# Patient Record
Sex: Female | Born: 1995 | Race: Black or African American | Hispanic: No | Marital: Single | Smoking: Never smoker
Health system: Southern US, Community
[De-identification: ages and names within clinical notes are randomized; demographics above are authoritative.]

## PROBLEM LIST (undated history)

## (undated) ENCOUNTER — Inpatient Hospital Stay (HOSPITAL_COMMUNITY): Payer: Self-pay

## (undated) DIAGNOSIS — E669 Obesity, unspecified: Secondary | ICD-10-CM

## (undated) DIAGNOSIS — Z789 Other specified health status: Secondary | ICD-10-CM

## (undated) HISTORY — PX: WISDOM TOOTH EXTRACTION: SHX21

## (undated) HISTORY — PX: TONSILLECTOMY: SUR1361

---

## 2015-05-14 ENCOUNTER — Encounter (HOSPITAL_COMMUNITY): Payer: Self-pay | Admitting: Emergency Medicine

## 2015-05-14 ENCOUNTER — Emergency Department (HOSPITAL_COMMUNITY): Payer: BLUE CROSS/BLUE SHIELD

## 2015-05-14 ENCOUNTER — Emergency Department (HOSPITAL_COMMUNITY)
Admission: EM | Admit: 2015-05-14 | Discharge: 2015-05-14 | Disposition: A | Discharge status: Home / Self Care | Payer: BLUE CROSS/BLUE SHIELD | Attending: Emergency Medicine | Admitting: Emergency Medicine

## 2015-05-14 DIAGNOSIS — J029 Acute pharyngitis, unspecified: Secondary | ICD-10-CM | POA: Diagnosis present

## 2015-05-14 DIAGNOSIS — J069 Acute upper respiratory infection, unspecified: Secondary | ICD-10-CM | POA: Diagnosis not present

## 2015-05-14 DIAGNOSIS — R079 Chest pain, unspecified: Secondary | ICD-10-CM | POA: Diagnosis not present

## 2015-05-14 LAB — RAPID STREP SCREEN (MED CTR MEBANE ONLY): STREPTOCOCCUS, GROUP A SCREEN (DIRECT): NEGATIVE

## 2015-05-14 NOTE — Discharge Instructions (Signed)
Tylenol 1000 mg rotated with Motrin 600 mg every 4 hours as needed for fever or pain.  Over-the-counter cough medications as needed for symptomatic relief.  Return to the ER if symptoms significantly worsen or change.   Upper Respiratory Infection, Adult Most upper respiratory infections (URIs) are a viral infection of the air passages leading to the lungs. A URI affects the nose, throat, and upper air passages. The most common type of URI is nasopharyngitis and is typically referred to as "the common cold." URIs run their course and usually go away on their own. Most of the time, a URI does not require medical attention, but sometimes a bacterial infection in the upper airways can follow a viral infection. This is called a secondary infection. Sinus and middle ear infections are common types of secondary upper respiratory infections. Bacterial pneumonia can also complicate a URI. A URI can worsen asthma and chronic obstructive pulmonary disease (COPD). Sometimes, these complications can require emergency medical care and may be life threatening.  CAUSES Almost all URIs are caused by viruses. A virus is a type of germ and can spread from one person to another.  RISKS FACTORS You may be at risk for a URI if:   You smoke.   You have chronic heart or lung disease.  You have a weakened defense (immune) system.   You are very young or very old.   You have nasal allergies or asthma.  You work in crowded or poorly ventilated areas.  You work in health care facilities or schools. SIGNS AND SYMPTOMS  Symptoms typically develop 2-3 days after you come in contact with a cold virus. Most viral URIs last 7-10 days. However, viral URIs from the influenza virus (flu virus) can last 14-18 days and are typically more severe. Symptoms may include:   Runny or stuffy (congested) nose.   Sneezing.   Cough.   Sore throat.   Headache.   Fatigue.   Fever.   Loss of appetite.   Pain  in your forehead, behind your eyes, and over your cheekbones (sinus pain).  Muscle aches.  DIAGNOSIS  Your health care provider may diagnose a URI by:  Physical exam.  Tests to check that your symptoms are not due to another condition such as:  Strep throat.  Sinusitis.  Pneumonia.  Asthma. TREATMENT  A URI goes away on its own with time. It cannot be cured with medicines, but medicines may be prescribed or recommended to relieve symptoms. Medicines may help:  Reduce your fever.  Reduce your cough.  Relieve nasal congestion. HOME CARE INSTRUCTIONS   Take medicines only as directed by your health care provider.   Gargle warm saltwater or take cough drops to comfort your throat as directed by your health care provider.  Use a warm mist humidifier or inhale steam from a shower to increase air moisture. This may make it easier to breathe.  Drink enough fluid to keep your urine clear or pale yellow.   Eat soups and other clear broths and maintain good nutrition.   Rest as needed.   Return to work when your temperature has returned to normal or as your health care provider advises. You may need to stay home longer to avoid infecting others. You can also use a face mask and careful hand washing to prevent spread of the virus.  Increase the usage of your inhaler if you have asthma.   Do not use any tobacco products, including cigarettes, chewing tobacco, or electronic  cigarettes. If you need help quitting, ask your health care provider. PREVENTION  The best way to protect yourself from getting a cold is to practice good hygiene.   Avoid oral or hand contact with people with cold symptoms.   Wash your hands often if contact occurs.  There is no clear evidence that vitamin C, vitamin E, echinacea, or exercise reduces the chance of developing a cold. However, it is always recommended to get plenty of rest, exercise, and practice good nutrition.  SEEK MEDICAL CARE IF:     You are getting worse rather than better.   Your symptoms are not controlled by medicine.   You have chills.  You have worsening shortness of breath.  You have brown or red mucus.  You have yellow or brown nasal discharge.  You have pain in your face, especially when you bend forward.  You have a fever.  You have swollen neck glands.  You have pain while swallowing.  You have white areas in the back of your throat. SEEK IMMEDIATE MEDICAL CARE IF:   You have severe or persistent:  Headache.  Ear pain.  Sinus pain.  Chest pain.  You have chronic lung disease and any of the following:  Wheezing.  Prolonged cough.  Coughing up blood.  A change in your usual mucus.  You have a stiff neck.  You have changes in your:  Vision.  Hearing.  Thinking.  Mood. MAKE SURE YOU:   Understand these instructions.  Will watch your condition.  Will get help right away if you are not doing well or get worse.   This information is not intended to replace advice given to you by your health care provider. Make sure you discuss any questions you have with your health care provider.   Document Released: 10/01/2000 Document Revised: 08/22/2014 Document Reviewed: 07/13/2013 Elsevier Interactive Patient Education [employer]

## 2015-05-14 NOTE — ED Notes (Signed)
Patient transported to X-ray 

## 2015-05-14 NOTE — ED Notes (Signed)
Pt reports chest "tightness" and sore throat since yesterday 8am.  Denies N/V, fever, diarrhea, SOB.

## 2015-05-14 NOTE — ED Provider Notes (Signed)
CSN: [id number]     Arrival date & time 05/14/15  0449 History   First MD Initiated Contact with Patient 05/14/15 0518     Chief Complaint  Patient presents with  . Sore Throat  . Chest Pain     (Consider location/radiation/quality/duration/timing/severity/associated sxs/prior Treatment) HPI Comments: Patient is a 20 year old female who presents for evaluation of sore throat and cough. This started yesterday. She feels tight in her chest. She denies any fevers or chills. She denies any difficulty breathing. She denies any ill contacts. She does report coughing up yellow sputum earlier today.  Patient is a 20 y.o. female presenting with pharyngitis.  Sore Throat This is a new problem. The current episode started yesterday. The problem occurs constantly. The problem has been gradually worsening. The symptoms are aggravated by swallowing (Coughing). Nothing relieves the symptoms. She has tried nothing for the symptoms. The treatment provided no relief.    History reviewed. No pertinent past medical history. Past Surgical History  Procedure Laterality Date  . Tonsillectomy     No family history on file. Social History  Substance Use Topics  . Smoking status: Never Smoker   . Smokeless tobacco: None  . Alcohol Use: No   OB History    No data available     Review of Systems  All other systems reviewed and are negative.     Allergies  Review of patient's allergies indicates no known allergies.  Home Medications   Prior to Admission medications   Not on File   BP 116/96 mmHg  Pulse 75  Temp(Src) 98.1 F (36.7 C) (Oral)  Resp 16  Ht  (1.575 m)  SpO2 100% Physical Exam  Constitutional: She is oriented to person, place, and time. She appears well-developed and well-nourished. No distress.  HENT:  Head: Normocephalic and atraumatic.  Mouth/Throat: Oropharynx is clear and moist. No oropharyngeal exudate.  Neck: Normal range of motion. Neck supple.  Cardiovascular:  Normal rate and regular rhythm.  Exam reveals no gallop and no friction rub.   No murmur heard. Pulmonary/Chest: Effort normal and breath sounds normal. No respiratory distress. She has no wheezes.  Abdominal: Soft. Bowel sounds are normal. She exhibits no distension. There is no tenderness.  Musculoskeletal: Normal range of motion.  Neurological: She is alert and oriented to person, place, and time.  Skin: Skin is warm and dry. She is not diaphoretic.  Nursing note and vitals reviewed.   ED Course  Procedures (including critical care time) Labs Review Labs Reviewed  RAPID STREP SCREEN (NOT AT [facility])    Imaging Review No results found. I have personally reviewed and evaluated these images and lab results as part of my medical decision-making.    MDM   Final diagnoses:  None    Patient presents here with complaints of chest tightness, cough, and sore throat since yesterday. Her chest x-ray is clear, strep test is negative, and vital signs are stable. She is nontoxic appearing. I suspect a viral etiology to her symptoms. I will recommend Tylenol, Motrin, and over-the-counter medications as needed for symptomatic relief.    Geoffery Lyons, MD 05/14/15 [phone]

## 2015-05-16 LAB — CULTURE, GROUP A STREP (THRC)

## 2015-05-27 ENCOUNTER — Encounter (HOSPITAL_COMMUNITY): Payer: Self-pay

## 2015-05-27 ENCOUNTER — Inpatient Hospital Stay (HOSPITAL_COMMUNITY)
Admission: AD | Admit: 2015-05-27 | Discharge: 2015-05-27 | Disposition: A | Discharge status: Home / Self Care | Payer: BLUE CROSS/BLUE SHIELD | Source: Ambulatory Visit | Attending: Obstetrics & Gynecology | Admitting: Obstetrics & Gynecology

## 2015-05-27 DIAGNOSIS — N921 Excessive and frequent menstruation with irregular cycle: Secondary | ICD-10-CM

## 2015-05-27 DIAGNOSIS — N939 Abnormal uterine and vaginal bleeding, unspecified: Secondary | ICD-10-CM | POA: Diagnosis present

## 2015-05-27 DIAGNOSIS — N390 Urinary tract infection, site not specified: Secondary | ICD-10-CM | POA: Diagnosis not present

## 2015-05-27 HISTORY — DX: Other specified health status: Z78.9

## 2015-05-27 LAB — URINALYSIS, ROUTINE W REFLEX MICROSCOPIC
BILIRUBIN URINE: NEGATIVE
Glucose, UA: NEGATIVE mg/dL
KETONES UR: 15 mg/dL — AB
NITRITE: POSITIVE — AB
PH: 5.5 (ref 5.0–8.0)
Protein, ur: 100 mg/dL — AB
Specific Gravity, Urine: 1.025 (ref 1.005–1.030)

## 2015-05-27 LAB — URINE MICROSCOPIC-ADD ON

## 2015-05-27 LAB — CBC
HCT: 32.9 % — ABNORMAL LOW (ref 36.0–46.0)
HEMOGLOBIN: 10.2 g/dL — AB (ref 12.0–15.0)
MCH: 22.9 pg — AB (ref 26.0–34.0)
MCHC: 31 g/dL (ref 30.0–36.0)
MCV: 73.9 fL — ABNORMAL LOW (ref 78.0–100.0)
PLATELETS: 364 10*3/uL (ref 150–400)
RBC: 4.45 MIL/uL (ref 3.87–5.11)
RDW: 15.7 % — AB (ref 11.5–15.5)
WBC: 9.7 10*3/uL (ref 4.0–10.5)

## 2015-05-27 LAB — WET PREP, GENITAL
CLUE CELLS WET PREP: NONE SEEN
SPERM: NONE SEEN
Trich, Wet Prep: NONE SEEN
YEAST WET PREP: NONE SEEN

## 2015-05-27 LAB — POCT PREGNANCY, URINE: Preg Test, Ur: NEGATIVE

## 2015-05-27 MED ORDER — NITROFURANTOIN MONOHYD MACRO 100 MG PO CAPS: 100.0000 mg | ORAL_CAPSULE | Freq: Two times a day (BID) | ORAL | Status: DC

## 2015-05-27 MED ORDER — NORGESTIMATE-ETH ESTRADIOL 0.25-35 MG-MCG PO TABS: 1.0000 | ORAL_TABLET | Freq: Every day | ORAL | Status: DC

## 2015-05-27 MED ORDER — KETOROLAC TROMETHAMINE 60 MG/2ML IM SOLN
60.0000 mg | Freq: Once | INTRAMUSCULAR | Status: AC
  Administered 2015-05-27: 60 mg via INTRAMUSCULAR
  Filled 2015-05-27: qty 2

## 2015-05-27 NOTE — MAU Note (Signed)
Pt reports heavy vaginal bleeding for the last month. Pt reports going through 2 pads per hour and having large clots. Pt reports abdominal and back pain starting about 2 weeks ago. Pt is not using birth control at present time.

## 2015-05-27 NOTE — MAU Provider Note (Signed)
History     CSN: [id number]  Arrival date and time: 05/27/15 2024   First Provider Initiated Contact with Patient 05/27/15 2136      Chief Complaint  Patient presents with  . Vaginal Bleeding   Vaginal Bleeding The patient's primary symptoms include pelvic pain and vaginal bleeding. This is a new problem. The current episode started more than 1 month ago. The problem occurs constantly. The problem has been unchanged. Pain severity now: 7/10. The problem affects both sides. She is pregnant. Associated symptoms include abdominal pain. Pertinent negatives include no chills, constipation, diarrhea, dysuria, fever, frequency, nausea, urgency or vomiting. The vaginal discharge was bloody. The vaginal bleeding is heavier than menses (bleeding through a tampon and a pad. ). She has been passing clots. She has not been passing tissue. Nothing aggravates the symptoms. She has tried nothing for the symptoms. She is sexually active. She uses oral contraceptives (was taking birth control pills, and she states that she "thinks" she is still it. ) for contraception. Her menstrual history has been irregular.   Past Medical History  Diagnosis Date  . Medical history non-contributory     Past Surgical History  Procedure Laterality Date  . Tonsillectomy    . Wisdom tooth extraction      Family History  Problem Relation Age of Onset  . Diabetes Father   . Diabetes Maternal Grandfather   . Cancer Paternal Grandmother     Social History  Substance Use Topics  . Smoking status: Never Smoker   . Smokeless tobacco: Never Used  . Alcohol Use: No    Allergies: No Known Allergies  Prescriptions prior to admission  Medication Sig Dispense Refill Last Dose  . norgestimate-ethinyl estradiol (ORTHO-CYCLEN,SPRINTEC,PREVIFEM) 0.25-35 MG-MCG tablet Take 1 tablet by mouth daily.   Past Week at Unknown time    Review of Systems  Constitutional: Negative for fever and chills.  Gastrointestinal: Positive  for abdominal pain. Negative for nausea, vomiting, diarrhea and constipation.  Genitourinary: Positive for vaginal bleeding and pelvic pain. Negative for dysuria, urgency and frequency.   Physical Exam   Blood pressure 142/88, pulse 98, temperature 98.8 F (37.1 C), temperature source Oral, resp. rate 18, height  (1.575 m), last menstrual period 04/30/2015, SpO2 100 %.  Physical Exam  Nursing note and vitals reviewed. Constitutional: She is oriented to person, place, and time. She appears well-developed and well-nourished. No distress.  HENT:  Head: Normocephalic.  Cardiovascular: Normal rate.   Respiratory: Effort normal.  GI: Soft. There is no tenderness. There is no rebound.  Genitourinary:   External: no lesion Vagina: moderate amount of blood seen  Cervix: pink, smooth, no CMT Uterus: NSSC Adnexa: NT   Neurological: She is alert and oriented to person, place, and time.  Skin: Skin is warm and dry.  Psychiatric: She has a normal mood and affect.    Results for orders placed or performed during the hospital encounter of 05/27/15 (from the past 24 hour(s))  Urinalysis, Routine w reflex microscopic (not at [facility])     Status: Abnormal   Collection Time: 05/27/15  8:30 PM  Result Value Ref Range   Color, Urine RED (A) YELLOW   APPearance HAZY (A) CLEAR   Specific Gravity, Urine 1.025 1.005 - 1.030   pH 5.5 5.0 - 8.0   Glucose, UA NEGATIVE NEGATIVE mg/dL   Hgb urine dipstick LARGE (A) NEGATIVE   Bilirubin Urine NEGATIVE NEGATIVE   Ketones, ur 15 (A) NEGATIVE mg/dL   Protein, ur  100 (A) NEGATIVE mg/dL   Nitrite POSITIVE (A) NEGATIVE   Leukocytes, UA TRACE (A) NEGATIVE  Urine microscopic-add on     Status: Abnormal   Collection Time: 05/27/15  8:30 PM  Result Value Ref Range   Squamous Epithelial / LPF 0-5 (A) NONE SEEN   WBC, UA 0-5 0 - 5 WBC/hpf   RBC / HPF TOO NUMEROUS TO COUNT 0 - 5 RBC/hpf   Bacteria, UA MANY (A) NONE SEEN  Pregnancy, urine POC     Status: None    Collection Time: 05/27/15  8:39 PM  Result Value Ref Range   Preg Test, Ur NEGATIVE NEGATIVE  CBC     Status: Abnormal   Collection Time: 05/27/15  9:20 PM  Result Value Ref Range   WBC 9.7 4.0 - 10.5 K/uL   RBC 4.45 3.87 - 5.11 MIL/uL   Hemoglobin 10.2 (L) 12.0 - 15.0 g/dL   HCT 16.1 (L) 09.6 - 04.5 %   MCV 73.9 (L) 78.0 - 100.0 fL   MCH 22.9 (L) 26.0 - 34.0 pg   MCHC 31.0 30.0 - 36.0 g/dL   RDW 40.9 (H) 81.1 - 91.4 %   Platelets 364 150 - 400 K/uL  Wet prep, genital     Status: Abnormal   Collection Time: 05/27/15  9:40 PM  Result Value Ref Range   Yeast Wet Prep HPF POC NONE SEEN NONE SEEN   Trich, Wet Prep NONE SEEN NONE SEEN   Clue Cells Wet Prep HPF POC NONE SEEN NONE SEEN   WBC, Wet Prep HPF POC MODERATE (A) NONE SEEN   Sperm NONE SEEN     MAU Course  Procedures  MDM   Assessment and Plan   1. UTI (lower urinary tract infection)   2. Breakthrough bleeding on birth control pills    DC home Comfort measures reviewed  Bleeding precautions RX: macrobid BID x 5 days #10  Return to MAU as needed FU with OB as planned  Follow-up Information    Follow up with [facility].   Specialty:  Obstetrics and Gynecology   Why:  They will call you with an appointment   Contact information:   [address] [place] [address] [phone]        Tawnya Crook 05/27/2015, 9:37 PM

## 2015-05-27 NOTE — Discharge Instructions (Signed)

## 2015-05-28 LAB — GC/CHLAMYDIA PROBE AMP (~~LOC~~) NOT AT ARMC
Chlamydia: NEGATIVE
NEISSERIA GONORRHEA: NEGATIVE

## 2015-06-11 ENCOUNTER — Inpatient Hospital Stay (HOSPITAL_COMMUNITY): Payer: BLUE CROSS/BLUE SHIELD

## 2015-06-11 ENCOUNTER — Inpatient Hospital Stay (HOSPITAL_COMMUNITY)
Admission: AD | Admit: 2015-06-11 | Discharge: 2015-06-11 | Disposition: A | Discharge status: Home / Self Care | Payer: BLUE CROSS/BLUE SHIELD | Source: Ambulatory Visit | Attending: Family Medicine | Admitting: Family Medicine

## 2015-06-11 ENCOUNTER — Encounter (HOSPITAL_COMMUNITY): Payer: Self-pay | Admitting: *Deleted

## 2015-06-11 DIAGNOSIS — N926 Irregular menstruation, unspecified: Secondary | ICD-10-CM | POA: Diagnosis not present

## 2015-06-11 DIAGNOSIS — N939 Abnormal uterine and vaginal bleeding, unspecified: Secondary | ICD-10-CM

## 2015-06-11 DIAGNOSIS — D500 Iron deficiency anemia secondary to blood loss (chronic): Secondary | ICD-10-CM | POA: Insufficient documentation

## 2015-06-11 LAB — URINE MICROSCOPIC-ADD ON

## 2015-06-11 LAB — URINALYSIS, ROUTINE W REFLEX MICROSCOPIC
BILIRUBIN URINE: NEGATIVE
GLUCOSE, UA: NEGATIVE mg/dL
KETONES UR: NEGATIVE mg/dL
NITRITE: NEGATIVE
PH: 6 (ref 5.0–8.0)
PROTEIN: 30 mg/dL — AB
Specific Gravity, Urine: 1.025 (ref 1.005–1.030)

## 2015-06-11 LAB — CBC
HEMATOCRIT: 29.5 % — AB (ref 36.0–46.0)
HEMOGLOBIN: 9.1 g/dL — AB (ref 12.0–15.0)
MCH: 22.9 pg — AB (ref 26.0–34.0)
MCHC: 30.8 g/dL (ref 30.0–36.0)
MCV: 74.1 fL — AB (ref 78.0–100.0)
PLATELETS: 346 10*3/uL (ref 150–400)
RBC: 3.98 MIL/uL (ref 3.87–5.11)
RDW: 15.7 % — AB (ref 11.5–15.5)
WBC: 10.2 10*3/uL (ref 4.0–10.5)

## 2015-06-11 LAB — POCT PREGNANCY, URINE: Preg Test, Ur: NEGATIVE

## 2015-06-11 MED ORDER — MEDROXYPROGESTERONE ACETATE 10 MG PO TABS: 10.0000 mg | ORAL_TABLET | Freq: Two times a day (BID) | ORAL | Status: DC

## 2015-06-11 MED ORDER — FERROUS SULFATE 325 (65 FE) MG PO TABS: 325.0000 mg | ORAL_TABLET | Freq: Two times a day (BID) | ORAL | Status: DC

## 2015-06-11 NOTE — MAU Note (Signed)
Heavy bleeding, passing heavy clots.  Was here earlier this month for the same   Cramps prior to passing clots.

## 2015-06-11 NOTE — Discharge Instructions (Signed)
Abnormal Uterine Bleeding °Abnormal uterine bleeding can affect women at various stages in life, including teenagers, women in their reproductive years, pregnant women, and women who have reached menopause. Several kinds of uterine bleeding are considered abnormal, including: °· Bleeding or spotting between periods.   °· Bleeding after sexual intercourse.   °· Bleeding that is heavier or more than normal.   °· Periods that last longer than usual. °· Bleeding after menopause.   °Many cases of abnormal uterine bleeding are minor and simple to treat, while others are more serious. Any type of abnormal bleeding should be evaluated by your health care provider. Treatment will depend on the cause of the bleeding. °HOME CARE INSTRUCTIONS °Monitor your condition for any changes. The following actions may help to alleviate any discomfort you are experiencing: °· Avoid the use of tampons and douches as directed by your health care provider. °· Change your pads frequently. °You should get regular pelvic exams and Pap tests. Keep all follow-up appointments for diagnostic tests as directed by your health care provider.  °SEEK MEDICAL CARE IF:  °· Your bleeding lasts more than 1 week.   °· You feel dizzy at times.   °SEEK IMMEDIATE MEDICAL CARE IF:  °· You pass out.   °· You are changing pads every 15 to 30 minutes.   °· You have abdominal pain. °· You have a fever.   °· You become sweaty or weak.   °· You are passing large blood clots from the vagina.   °· You start to feel nauseous and vomit. °MAKE SURE YOU:  °· Understand these instructions. °· Will watch your condition. °· Will get help right away if you are not doing well or get worse. °  °This information is not intended to replace advice given to you by your health care provider. Make sure you discuss any questions you have with your health care provider. °  °Document Released: 04/07/2005 Document Revised: 04/12/2013 Document Reviewed: 11/04/2012 °Elsevier Interactive  Patient Education ©2016 [employer] °Anemia, Nonspecific °Anemia is a condition in which the concentration of red blood cells or hemoglobin in the blood is below normal. Hemoglobin is a substance in red blood cells that carries oxygen to the tissues of the body. Anemia results in not enough oxygen reaching these tissues.  °CAUSES  °Common causes of anemia include:  °· Excessive bleeding. Bleeding may be internal or external. This includes excessive bleeding from periods (in women) or from the intestine.   °· Poor nutrition.   °· Chronic kidney, thyroid, and liver disease.  °· Bone marrow disorders that decrease red blood cell production. °· Cancer and treatments for cancer. °· HIV, AIDS, and their treatments. °· Spleen problems that increase red blood cell destruction. °· Blood disorders. °· Excess destruction of red blood cells due to infection, medicines, and autoimmune disorders. °SIGNS AND SYMPTOMS  °· Minor weakness.   °· Dizziness.   °· Headache. °· Palpitations.   °· Shortness of breath, especially with exercise.   °· Paleness. °· Cold sensitivity. °· Indigestion. °· Nausea. °· Difficulty sleeping. °· Difficulty concentrating. °Symptoms may occur suddenly or they may develop slowly.  °DIAGNOSIS  °Additional blood tests are often needed. These help your health care provider determine the best treatment. Your health care provider will check your stool for blood and look for other causes of blood loss.  °TREATMENT  °Treatment varies depending on the cause of the anemia. Treatment can include:  °· Supplements of iron, vitamin B12, or folic acid.   °· Hormone medicines.   °· A blood transfusion. This may be needed if blood loss is   severe.   °· Hospitalization. This may be needed if there is significant continual blood loss.   °· Dietary changes. °· Spleen removal. °HOME CARE INSTRUCTIONS °Keep all follow-up appointments. It often takes many weeks to correct anemia, and having your health care provider check on  your condition and your response to treatment is very important. °SEEK IMMEDIATE MEDICAL CARE IF:  °· You develop extreme weakness, shortness of breath, or chest pain.   °· You become dizzy or have trouble concentrating. °· You develop heavy vaginal bleeding.   °· You develop a rash.   °· You have bloody or black, tarry stools.   °· You faint.   °· You vomit up blood.   °· You vomit repeatedly.   °· You have abdominal pain. °· You have a fever or persistent symptoms for more than 2-3 days.   °· You have a fever and your symptoms suddenly get worse.   °· You are dehydrated.   °MAKE SURE YOU: °· Understand these instructions. °· Will watch your condition. °· Will get help right away if you are not doing well or get worse. °  °This information is not intended to replace advice given to you by your health care provider. Make sure you discuss any questions you have with your health care provider. °  °Document Released: 05/15/2004 Document Revised: 12/08/2012 Document Reviewed: 10/01/2012 °Elsevier Interactive Patient Education ©2016 [employer] ° °

## 2015-06-11 NOTE — MAU Provider Note (Signed)
History     CSN: [id number]  Arrival date and time: 06/11/15 1630   None     Chief Complaint  Patient presents with  . Vaginal Bleeding   HPI   Julie Malone is a 20 y.o. female G0P0000 with a history of abnormal menstrual cycles presents to MAU with vaginal bleeding. She was seen on 2/5 with vaginal bleeding; she was already taking birth control pills. No changes were made to her birth control pills at that time and she has been taking them as prescribed.  She has been on this same pill since Oct.  She did not have a period in Nov, Dec and started bleeding on January 4th. The bleeding has continued since then.   She is passing large clots at times.   Patient is not taking Iron pills. " I was never told I needed too".   Patient is scheduled for an appointment in the [facility] on Feb 23rd at 1330  OB History    Gravida Para Term Preterm AB TAB SAB Ectopic Multiple Living        Past Medical History  Diagnosis Date  . Medical history non-contributory     Past Surgical History  Procedure Laterality Date  . Tonsillectomy    . Wisdom tooth extraction      Family History  Problem Relation Age of Onset  . Diabetes Father   . Diabetes Maternal Grandfather   . Cancer Paternal Grandmother     Social History  Substance Use Topics  . Smoking status: Never Smoker   . Smokeless tobacco: Never Used  . Alcohol Use: No    Allergies: No Known Allergies  Prescriptions prior to admission  Medication Sig Dispense Refill Last Dose  . norgestimate-ethinyl estradiol (MONONESSA) 0.25-35 MG-MCG tablet Take 1 tablet by mouth daily. 1 Package 11 06/10/2015 at Unknown time  . nitrofurantoin, macrocrystal-monohydrate, (MACROBID) 100 MG capsule Take 1 capsule (100 mg total) by mouth 2 (two) times daily. (Patient not taking: Reported on 06/11/2015) 10 capsule 0    Results for orders placed or performed during the hospital encounter of 06/11/15 (from the past 48 hour(s))   Urinalysis, Routine w reflex microscopic (not at [facility])     Status: Abnormal   Collection Time: 06/11/15  5:00 PM  Result Value Ref Range   Color, Urine RED (A) YELLOW    Comment: BIOCHEMICALS MAY BE AFFECTED BY COLOR   APPearance HAZY (A) CLEAR   Specific Gravity, Urine 1.025 1.005 - 1.030   pH 6.0 5.0 - 8.0   Glucose, UA NEGATIVE NEGATIVE mg/dL   Hgb urine dipstick LARGE (A) NEGATIVE   Bilirubin Urine NEGATIVE NEGATIVE   Ketones, ur NEGATIVE NEGATIVE mg/dL   Protein, ur 30 (A) NEGATIVE mg/dL   Nitrite NEGATIVE NEGATIVE   Leukocytes, UA SMALL (A) NEGATIVE  Urine microscopic-add on     Status: Abnormal   Collection Time: 06/11/15  5:00 PM  Result Value Ref Range   Squamous Epithelial / LPF 0-5 (A) NONE SEEN   WBC, UA 0-5 0 - 5 WBC/hpf   RBC / HPF TOO NUMEROUS TO COUNT 0 - 5 RBC/hpf   Bacteria, UA FEW (A) NONE SEEN   Urine-Other URINALYSIS PERFORMED ON SUPERNATANT   Pregnancy, urine POC     Status: None   Collection Time: 06/11/15  5:12 PM  Result Value Ref Range   Preg Test, Ur NEGATIVE NEGATIVE    Comment:  THE SENSITIVITY OF THIS METHODOLOGY IS >24 mIU/mL   CBC     Status: Abnormal   Collection Time: 06/11/15  6:14 PM  Result Value Ref Range   WBC 10.2 4.0 - 10.5 K/uL   RBC 3.98 3.87 - 5.11 MIL/uL   Hemoglobin 9.1 (L) 12.0 - 15.0 g/dL   HCT 16.1 (L) 09.6 - 04.5 %   MCV 74.1 (L) 78.0 - 100.0 fL    Comment: REPEATED TO VERIFY   MCH 22.9 (L) 26.0 - 34.0 pg   MCHC 30.8 30.0 - 36.0 g/dL   RDW 40.9 (H) 81.1 - 91.4 %   Platelets 346 150 - 400 K/uL   Results for orders placed or performed during the hospital encounter of 06/11/15 (from the past 48 hour(s))  Urinalysis, Routine w reflex microscopic (not at [facility])     Status: Abnormal   Collection Time: 06/11/15  5:00 PM  Result Value Ref Range   Color, Urine RED (A) YELLOW    Comment: BIOCHEMICALS MAY BE AFFECTED BY COLOR   APPearance HAZY (A) CLEAR   Specific Gravity, Urine 1.025 1.005 - 1.030   pH 6.0 5.0 - 8.0    Glucose, UA NEGATIVE NEGATIVE mg/dL   Hgb urine dipstick LARGE (A) NEGATIVE   Bilirubin Urine NEGATIVE NEGATIVE   Ketones, ur NEGATIVE NEGATIVE mg/dL   Protein, ur 30 (A) NEGATIVE mg/dL   Nitrite NEGATIVE NEGATIVE   Leukocytes, UA SMALL (A) NEGATIVE  Urine microscopic-add on     Status: Abnormal   Collection Time: 06/11/15  5:00 PM  Result Value Ref Range   Squamous Epithelial / LPF 0-5 (A) NONE SEEN   WBC, UA 0-5 0 - 5 WBC/hpf   RBC / HPF TOO NUMEROUS TO COUNT 0 - 5 RBC/hpf   Bacteria, UA FEW (A) NONE SEEN   Urine-Other URINALYSIS PERFORMED ON SUPERNATANT   Pregnancy, urine POC     Status: None   Collection Time: 06/11/15  5:12 PM  Result Value Ref Range   Preg Test, Ur NEGATIVE NEGATIVE    Comment:        THE SENSITIVITY OF THIS METHODOLOGY IS >24 mIU/mL   CBC     Status: Abnormal   Collection Time: 06/11/15  6:14 PM  Result Value Ref Range   WBC 10.2 4.0 - 10.5 K/uL   RBC 3.98 3.87 - 5.11 MIL/uL   Hemoglobin 9.1 (L) 12.0 - 15.0 g/dL   HCT 78.2 (L) 95.6 - 21.3 %   MCV 74.1 (L) 78.0 - 100.0 fL    Comment: REPEATED TO VERIFY   MCH 22.9 (L) 26.0 - 34.0 pg   MCHC 30.8 30.0 - 36.0 g/dL   RDW 08.6 (H) 57.8 - 46.9 %   Platelets 346 150 - 400 K/uL    US Transvaginal Non-ob  06/11/2015  CLINICAL DATA:  Vaginal bleeding. EXAM: TRANSABDOMINAL AND TRANSVAGINAL ULTRASOUND OF PELVIS TECHNIQUE: Both transabdominal and transvaginal ultrasound examinations of the pelvis were performed. Transabdominal technique was performed for global imaging of the pelvis including uterus, ovaries, adnexal regions, and pelvic cul-de-sac. It was necessary to proceed with endovaginal exam following the transabdominal exam to visualize the ovaries and endometrium. COMPARISON:  None FINDINGS: Uterus Measurements: 8.5 x 3.8 x 4.0 cm. No fibroids or other mass visualized. Endometrium Thickness: 7.0 mm. Heterogeneous endometrial contents, extending into the dilated endocervical canal. This is likely hematoma.  Could not exclude products of conception. Right ovary Measurements: 2.7 x 2.0 x 2.3 cm. Normal appearance/no adnexal mass. Left  ovary Measurements: 2.5 x 2.2 x 1.8 cm. Normal appearance/no adnexal mass. Other findings No abnormal free fluid. IMPRESSION: 1. Heterogeneous endometrial contents extending into the dilated upper cervical canal. This could be a combination of hematoma and products of conception. 2. Normal myometrium. 3. Normal ovaries. Electronically Signed   By: Rudie Meyer M.D.   On: 06/11/2015 19:33   US Pelvis Complete  06/11/2015  CLINICAL DATA:  Vaginal bleeding. EXAM: TRANSABDOMINAL AND TRANSVAGINAL ULTRASOUND OF PELVIS TECHNIQUE: Both transabdominal and transvaginal ultrasound examinations of the pelvis were performed. Transabdominal technique was performed for global imaging of the pelvis including uterus, ovaries, adnexal regions, and pelvic cul-de-sac. It was necessary to proceed with endovaginal exam following the transabdominal exam to visualize the ovaries and endometrium. COMPARISON:  None FINDINGS: Uterus Measurements: 8.5 x 3.8 x 4.0 cm. No fibroids or other mass visualized. Endometrium Thickness: 7.0 mm. Heterogeneous endometrial contents, extending into the dilated endocervical canal. This is likely hematoma. Could not exclude products of conception. Right ovary Measurements: 2.7 x 2.0 x 2.3 cm. Normal appearance/no adnexal mass. Left ovary Measurements: 2.5 x 2.2 x 1.8 cm. Normal appearance/no adnexal mass. Other findings No abnormal free fluid. IMPRESSION: 1. Heterogeneous endometrial contents extending into the dilated upper cervical canal. This could be a combination of hematoma and products of conception. 2. Normal myometrium. 3. Normal ovaries. Electronically Signed   By: Rudie Meyer M.D.   On: 06/11/2015 19:33    Review of Systems  Constitutional: Negative for fever and chills.  Neurological: Positive for weakness. Negative for dizziness (None now; occasional dizziness  ).   Physical Exam   Blood pressure 131/73, pulse 90, temperature 98.7 F (37.1 C), temperature source Oral, resp. rate 18, height 5\' 3"  (1.6 m), weight 257 lb 6.4 oz (116.756 kg), last menstrual period 04/25/2015.  Physical Exam  Constitutional: She is oriented to person, place, and time. She appears well-developed and well-nourished. No distress.  HENT:  Head: Normocephalic.  Respiratory: Effort normal.  GI: Soft. She exhibits no distension. There is no tenderness. There is no rebound and no guarding.  Genitourinary:  Speculum exam: Vagina - Small amount of dark red blood noted in the vaginal canal, no odor. No clots  Cervix - smooth, pink Bimanual exam: Cervix slightly open, no CMT  Uterus non tender, normal size Adnexa non tender, no masses bilaterally Chaperone present for exam.  Musculoskeletal: Normal range of motion.  Neurological: She is alert and oriented to person, place, and time.  Skin: Skin is warm. She is not diaphoretic.  Psychiatric: Her behavior is normal.    MAU Course  Procedures  None  MDM  CBC 2/5: 10.2 CBC today 9.1  Discussed patient with Dr. Adrian Blackwater No Pelvic US on file. Pelvic US ordered due to drop in hemoglobin and patient reports occasional dizziness.   Pregnancy test negative.    Assessment and Plan    A:  1. Abnormal menstrual cycle   2. Vaginal bleeding   3. Anemia due to chronic blood loss     P:  Discharge home in stable condition  RX: Iron, provera Keep your appointment with [facility] on Thursday Bleeding precautions Return to MAU if symptoms worsen   Duane Lope, NP 06/11/2015 7:51 PM

## 2015-06-14 ENCOUNTER — Encounter: Payer: Self-pay | Admitting: Obstetrics & Gynecology

## 2015-06-14 ENCOUNTER — Ambulatory Visit (INDEPENDENT_AMBULATORY_CARE_PROVIDER_SITE_OTHER): Payer: BLUE CROSS/BLUE SHIELD | Admitting: Obstetrics & Gynecology

## 2015-06-14 VITALS — BP 136/66 | HR 84 | Ht 62.0 in | Wt 258.3 lb

## 2015-06-14 DIAGNOSIS — N921 Excessive and frequent menstruation with irregular cycle: Secondary | ICD-10-CM | POA: Diagnosis not present

## 2015-06-14 MED ORDER — DOXYCYCLINE HYCLATE 100 MG PO CAPS: 100.0000 mg | ORAL_CAPSULE | Freq: Two times a day (BID) | ORAL | Status: DC

## 2015-06-14 MED ORDER — IBUPROFEN 600 MG PO TABS: 600.0000 mg | ORAL_TABLET | Freq: Four times a day (QID) | ORAL | Status: DC | PRN

## 2015-06-14 NOTE — Progress Notes (Signed)
Patient ID: Julie Malone, female   DOB: [birth year], 20 y.o.   MRN: [id number]  Chief Complaint  Patient presents with  . Menorrhagia  BTB on OCP  HPI Julie Malone is a 20 y.o. female.  G0P0000  Irregular bleeding on OCP started in December and has persisted since early Jan with heavy bleeding at times, mild cramps. Has not responded to addition of Provera. States she is not sexually active currently.  HPI  Past Medical History  Diagnosis Date  . Medical history non-contributory     Past Surgical History  Procedure Laterality Date  . Tonsillectomy    . Wisdom tooth extraction      Family History  Problem Relation Age of Onset  . Diabetes Father   . Diabetes Maternal Grandfather   . Cancer Paternal Grandmother     Social History Social History  Substance Use Topics  . Smoking status: Never Smoker   . Smokeless tobacco: Never Used  . Alcohol Use: No    No Known Allergies  Current Outpatient Prescriptions  Medication Sig Dispense Refill  . ferrous sulfate 325 (65 FE) MG tablet Take 1 tablet (325 mg total) by mouth 2 (two) times daily with a meal. 30 tablet 0  . medroxyPROGESTERone (PROVERA) 10 MG tablet Take 1 tablet (10 mg total) by mouth 2 (two) times daily. 20 tablet 0  . norgestimate-ethinyl estradiol (MONONESSA) 0.25-35 MG-MCG tablet Take 1 tablet by mouth daily. 1 Package 11  . doxycycline (VIBRAMYCIN) 100 MG capsule Take 1 capsule (100 mg total) by mouth 2 (two) times daily. 20 capsule 0  . ibuprofen (ADVIL,MOTRIN) 600 MG tablet Take 1 tablet (600 mg total) by mouth every 6 (six) hours as needed. 30 tablet 1   No current facility-administered medications for this visit.    Review of Systems Review of Systems  Constitutional: Negative.   Genitourinary: Positive for menstrual problem and pelvic pain. Negative for vaginal discharge.    Blood pressure 136/66, pulse 84, height  (1.575 m), weight 258 lb 4.8 oz (117.164 kg), last menstrual period  04/25/2015.  Physical Exam Physical Exam  Constitutional: She appears well-developed. No distress.  Cardiovascular: Normal rate.   Pulmonary/Chest: Effort normal.  Psychiatric: She has a normal mood and affect. Her behavior is normal.    Data Reviewed CLINICAL DATA: Vaginal bleeding.  EXAM: TRANSABDOMINAL AND TRANSVAGINAL ULTRASOUND OF PELVIS  TECHNIQUE: Both transabdominal and transvaginal ultrasound examinations of the pelvis were performed. Transabdominal technique was performed for global imaging of the pelvis including uterus, ovaries, adnexal regions, and pelvic cul-de-sac. It was necessary to proceed with endovaginal exam following the transabdominal exam to visualize the ovaries and endometrium.  COMPARISON: None  FINDINGS: Uterus  Measurements: 8.5 x 3.8 x 4.0 cm. No fibroids or other mass visualized.  Endometrium  Thickness: 7.0 mm. Heterogeneous endometrial contents, extending into the dilated endocervical canal. This is likely hematoma. Could not exclude products of conception.  Right ovary  Measurements: 2.7 x 2.0 x 2.3 cm. Normal appearance/no adnexal mass.  Left ovary  Measurements: 2.5 x 2.2 x 1.8 cm. Normal appearance/no adnexal mass.  Other findings  No abnormal free fluid.  IMPRESSION: 1. Heterogeneous endometrial contents extending into the dilated upper cervical canal. This could be a combination of hematoma and products of conception. 2. Normal myometrium. 3. Normal ovaries.   Electronically Signed  By: Rudie Meyer M.D.  On: 06/11/2015 19:33  Assessment    DUB, BTB on OCP not responsive to addition of Provera  Plan    D/C OCP for now, continue to abstain which says she will do Ibuprofen 600 mg 2-3 / day for 7 days Doxycycline 100 mg BID 10 days possible chronic endometritis        ARNOLD,JAMES 06/14/2015, 2:37 PM

## 2015-07-12 ENCOUNTER — Ambulatory Visit: Payer: BLUE CROSS/BLUE SHIELD | Admitting: Obstetrics & Gynecology

## 2015-07-30 ENCOUNTER — Inpatient Hospital Stay (HOSPITAL_COMMUNITY)
Admission: AD | Admit: 2015-07-30 | Discharge: 2015-07-30 | Disposition: A | Discharge status: Home / Self Care | Payer: BLUE CROSS/BLUE SHIELD | Source: Ambulatory Visit | Attending: Obstetrics & Gynecology | Admitting: Obstetrics & Gynecology

## 2015-07-30 ENCOUNTER — Encounter (HOSPITAL_COMMUNITY): Payer: Self-pay | Admitting: *Deleted

## 2015-07-30 DIAGNOSIS — R102 Pelvic and perineal pain: Secondary | ICD-10-CM

## 2015-07-30 DIAGNOSIS — R079 Chest pain, unspecified: Secondary | ICD-10-CM | POA: Diagnosis present

## 2015-07-30 DIAGNOSIS — O9989 Other specified diseases and conditions complicating pregnancy, childbirth and the puerperium: Secondary | ICD-10-CM | POA: Diagnosis not present

## 2015-07-30 DIAGNOSIS — Z3201 Encounter for pregnancy test, result positive: Secondary | ICD-10-CM | POA: Insufficient documentation

## 2015-07-30 DIAGNOSIS — O99019 Anemia complicating pregnancy, unspecified trimester: Secondary | ICD-10-CM | POA: Diagnosis not present

## 2015-07-30 DIAGNOSIS — R55 Syncope and collapse: Secondary | ICD-10-CM | POA: Diagnosis present

## 2015-07-30 DIAGNOSIS — D649 Anemia, unspecified: Secondary | ICD-10-CM

## 2015-07-30 LAB — CBC
HEMATOCRIT: 32.9 % — AB (ref 36.0–46.0)
HEMOGLOBIN: 10 g/dL — AB (ref 12.0–15.0)
MCH: 21.5 pg — AB (ref 26.0–34.0)
MCHC: 30.4 g/dL (ref 30.0–36.0)
MCV: 70.6 fL — AB (ref 78.0–100.0)
Platelets: 409 10*3/uL — ABNORMAL HIGH (ref 150–400)
RBC: 4.66 MIL/uL (ref 3.87–5.11)
RDW: 16.2 % — ABNORMAL HIGH (ref 11.5–15.5)
WBC: 11.6 10*3/uL — ABNORMAL HIGH (ref 4.0–10.5)

## 2015-07-30 LAB — URINE MICROSCOPIC-ADD ON

## 2015-07-30 LAB — URINALYSIS, ROUTINE W REFLEX MICROSCOPIC
Bilirubin Urine: NEGATIVE
GLUCOSE, UA: NEGATIVE mg/dL
Ketones, ur: NEGATIVE mg/dL
LEUKOCYTES UA: NEGATIVE
Nitrite: NEGATIVE
PH: 5.5 (ref 5.0–8.0)
Protein, ur: NEGATIVE mg/dL
SPECIFIC GRAVITY, URINE: 1.02 (ref 1.005–1.030)

## 2015-07-30 LAB — HCG, QUANTITATIVE, PREGNANCY: HCG, BETA CHAIN, QUANT, S: 42 m[IU]/mL — AB (ref <5)

## 2015-07-30 LAB — POCT PREGNANCY, URINE: Preg Test, Ur: NEGATIVE

## 2015-07-30 MED ORDER — PROMETHAZINE HCL 25 MG PO TABS: 25.0000 mg | ORAL_TABLET | Freq: Four times a day (QID) | ORAL | Status: DC | PRN

## 2015-07-30 NOTE — Discharge Instructions (Signed)
First Trimester of Pregnancy  The first trimester of pregnancy is from week 1 until the end of week 12 (months 1 through 3). A week after a sperm fertilizes an egg, the egg will implant on the wall of the uterus. This embryo will begin to develop into a baby. Genes from you and your partner are forming the baby. The female genes determine whether the baby is a boy or a girl. At 6-8 weeks, the eyes and face are formed, and the heartbeat can be seen on ultrasound. At the end of 12 weeks, all the baby's organs are formed.   Now that you are pregnant, you will want to do everything you can to have a healthy baby. Two of the most important things are to get good prenatal care and to follow your health care provider's instructions. Prenatal care is all the medical care you receive before the baby's birth. This care will help prevent, find, and treat any problems during the pregnancy and childbirth.  BODY CHANGES  Your body goes through many changes during pregnancy. The changes vary from woman to woman.   · You may gain or lose a couple of pounds at first.  · You may feel sick to your stomach (nauseous) and throw up (vomit). If the vomiting is uncontrollable, call your health care provider.  · You may tire easily.  · You may develop headaches that can be relieved by medicines approved by your health care provider.  · You may urinate more often. Painful urination may mean you have a bladder infection.  · You may develop heartburn as a result of your pregnancy.  · You may develop constipation because certain hormones are causing the muscles that push waste through your intestines to slow down.  · You may develop hemorrhoids or swollen, bulging veins (varicose veins).  · Your breasts may begin to grow larger and become tender. Your nipples may stick out more, and the tissue that surrounds them (areola) may become darker.  · Your gums may bleed and may be sensitive to brushing and flossing.   · Dark spots or blotches (chloasma, mask of pregnancy) may develop on your face. This will likely fade after the baby is born.  · Your menstrual periods will stop.  · You may have a loss of appetite.  · You may develop cravings for certain kinds of food.  · You may have changes in your emotions from day to day, such as being excited to be pregnant or being concerned that something may go wrong with the pregnancy and baby.  · You may have more vivid and strange dreams.  · You may have changes in your hair. These can include thickening of your hair, rapid growth, and changes in texture. Some women also have hair loss during or after pregnancy, or hair that feels dry or thin. Your hair will most likely return to normal after your baby is born.  WHAT TO EXPECT AT YOUR PRENATAL VISITS  During a routine prenatal visit:  · You will be weighed to make sure you and the baby are growing normally.  · Your blood pressure will be taken.  · Your abdomen will be measured to track your baby's growth.  · The fetal heartbeat will be listened to starting around week 10 or 12 of your pregnancy.  · Test results from any previous visits will be discussed.  Your health care provider may ask you:  · How you are feeling.  · If you   including cigarettes, chewing tobacco, and electronic cigarettes. °· If you have any questions. °Other tests that may be performed during your first trimester include: °· Blood tests to find your blood type and to check for the presence of any previous infections. They will also be used to check for low iron levels (anemia) and Rh antibodies. Later in the pregnancy, blood tests for diabetes will be done along with other tests if problems develop. °· Urine tests to check for infections, diabetes, or protein in the urine. °· An ultrasound to confirm the proper growth  and development of the baby. °· An amniocentesis to check for possible genetic problems. °· Fetal screens for spina bifida and Down syndrome. °· You may need other tests to make sure you and the baby are doing well. °· HIV (human immunodeficiency virus) testing. Routine prenatal testing includes screening for HIV, unless you choose not to have this test. °HOME CARE INSTRUCTIONS  °Medicines °· Follow your health care provider's instructions regarding medicine use. Specific medicines may be either safe or unsafe to take during pregnancy. °· Take your prenatal vitamins as directed. °· If you develop constipation, try taking a stool softener if your health care provider approves. °Diet °· Eat regular, well-balanced meals. Choose a variety of foods, such as meat or vegetable-based protein, fish, milk and low-fat dairy products, vegetables, fruits, and whole grain breads and cereals. Your health care provider will help you determine the amount of weight gain that is right for you. °· Avoid raw meat and uncooked cheese. These carry germs that can cause birth defects in the baby. °· Eating four or five small meals rather than three large meals a day may help relieve nausea and vomiting. If you start to feel nauseous, eating a few soda crackers can be helpful. Drinking liquids between meals instead of during meals also seems to help nausea and vomiting. °· If you develop constipation, eat more high-fiber foods, such as fresh vegetables or fruit and whole grains. Drink enough fluids to keep your urine clear or pale yellow. °Activity and Exercise °· Exercise only as directed by your health care provider. Exercising will help you: °¨ Control your weight. °¨ Stay in shape. °¨ Be prepared for labor and delivery. °· Experiencing pain or cramping in the lower abdomen or low back is a good sign that you should stop exercising. Check with your health care provider before continuing normal exercises. °· Try to avoid standing for long  periods of time. Move your legs often if you must stand in one place for a long time. °· Avoid heavy lifting. °· Wear low-heeled shoes, and practice good posture. °· You may continue to have sex unless your health care provider directs you otherwise. °Relief of Pain or Discomfort °· Wear a good support bra for breast tenderness.   °· Take warm sitz baths to soothe any pain or discomfort caused by hemorrhoids. Use hemorrhoid cream if your health care provider approves.   °· Rest with your legs elevated if you have leg cramps or low back pain. °· If you develop varicose veins in your legs, wear support hose. Elevate your feet for 15 minutes, 3-4 times a day. Limit salt in your diet. °Prenatal Care °· Schedule your prenatal visits by the twelfth week of pregnancy. They are usually scheduled monthly at first, then more often in the last 2 months before delivery. °· Write down your questions. Take them to your prenatal visits. °· Keep all your prenatal visits as directed by your   health care provider. °Safety °· Wear your seat belt at all times when driving. °· Make a list of emergency phone numbers, including numbers for family, friends, the hospital, and police and fire departments. °General Tips °· Ask your health care provider for a referral to a local prenatal education class. Begin classes no later than at the beginning of month 6 of your pregnancy. °· Ask for help if you have counseling or nutritional needs during pregnancy. Your health care provider can offer advice or refer you to specialists for help with various needs. °· Do not use hot tubs, steam rooms, or saunas. °· Do not douche or use tampons or scented sanitary pads. °· Do not cross your legs for long periods of time. °· Avoid cat litter boxes and soil used by cats. These carry germs that can cause birth defects in the baby and possibly loss of the fetus by miscarriage or stillbirth. °· Avoid all smoking, herbs, alcohol, and medicines not prescribed by  your health care provider. Chemicals in these affect the formation and growth of the baby. °· Do not use any tobacco products, including cigarettes, chewing tobacco, and electronic cigarettes. If you need help quitting, ask your health care provider. You may receive counseling support and other resources to help you quit. °· Schedule a dentist appointment. At home, brush your teeth with a soft toothbrush and be gentle when you floss. °SEEK MEDICAL CARE IF:  °· You have dizziness. °· You have mild pelvic cramps, pelvic pressure, or nagging pain in the abdominal area. °· You have persistent nausea, vomiting, or diarrhea. °· You have a bad smelling vaginal discharge. °· You have pain with urination. °· You notice increased swelling in your face, hands, legs, or ankles. °SEEK IMMEDIATE MEDICAL CARE IF:  °· You have a fever. °· You are leaking fluid from your vagina. °· You have spotting or bleeding from your vagina. °· You have severe abdominal cramping or pain. °· You have rapid weight gain or loss. °· You vomit blood or material that looks like coffee grounds. °· You are exposed to German measles and have never had them. °· You are exposed to fifth disease or chickenpox. °· You develop a severe headache. °· You have shortness of breath. °· You have any kind of trauma, such as from a fall or a car accident. °  °This information is not intended to replace advice given to you by your health care provider. Make sure you discuss any questions you have with your health care provider. °  °Document Released: 04/01/2001 Document Revised: 04/28/2014 Document Reviewed: 02/15/2013 °Elsevier Interactive Patient Education ©2016 [employer] ° ° ° °Safe Medications in Pregnancy  ° °Acne: °Benzoyl Peroxide °Salicylic Acid ° °Backache/Headache: °Tylenol: 2 regular strength every 4 hours OR °             2 Extra strength every 6 hours ° °Colds/Coughs/Allergies: °Benadryl (alcohol free) 25 mg every 6 hours as needed °Breath right  strips °Claritin °Cepacol throat lozenges °Chloraseptic throat spray °Cold-Eeze- up to three times per day °Cough drops, alcohol free °Flonase (by prescription only) °Guaifenesin °Mucinex °Robitussin DM (plain only, alcohol free) °Saline nasal spray/drops °Sudafed (pseudoephedrine) & Actifed ** use only after [redacted] weeks gestation and if you do not have high blood pressure °Tylenol °Vicks Vaporub °Zinc lozenges °Zyrtec  ° °Constipation: °Colace °Ducolax suppositories °Fleet enema °Glycerin suppositories °Metamucil °Milk of magnesia °Miralax °Senokot °Smooth move tea ° °Diarrhea: °Kaopectate °Imodium A-D ° °*NO pepto Bismol ° °Hemorrhoids: °  Hemorrhoids: Anusol Anusol HC Preparation H Tucks  Indigestion: Tums Maalox Mylanta Zantac  Pepcid  Insomnia: Benadryl (alcohol free) 25mg  every 6 hours as needed Tylenol PM Unisom, no Gelcaps  Leg Cramps: Tums MagGel  Nausea/Vomiting:  Bonine Dramamine Emetrol Ginger extract Sea bands Meclizine  Nausea medication to take during pregnancy:  Unisom (doxylamine succinate 25 mg tablets) Take one tablet daily at bedtime. If symptoms are not adequately controlled, the dose can be increased to a maximum recommended dose of two tablets daily (1/2 tablet in the morning, 1/2 tablet mid-afternoon and one at bedtime). Vitamin B6 100mg  tablets. Take one tablet twice a day (up to 200 mg per day).  Skin Rashes: Aveeno products Benadryl cream or 25mg  every 6 hours as needed Calamine Lotion 1% cortisone cream  Yeast infection: Gyne-lotrimin 7 Monistat 7  Gum/tooth pain: Anbesol  **If taking multiple medications, please check labels to avoid duplicating the same active ingredients **take medication as directed on the label ** Do not exceed 4000 mg of tylenol in 24 hours **Do not take medications that contain aspirin or ibuprofen    [facility]   Francoise CeoBernard Marshall      Phone: [phone]  [facility]     Phone:  [phone]  [facility]  Phone: [phone]  [facility] [place]  Phone: [phone]  [facility]    Phone: [phone]   [facility]    Phone: [phone]  [facility]    Phone: [phone]  [facility]    Phone: [phone]  [facility]    Phone: [phone]  [facility] Phone: [phone]   [facility]  Phone: [phone]  [facility]    Phone: [phone]  [facility]   Phone: [phone]  [facility]      Phone: [phone]  [facility]    Phone: [phone]  [facility]     Phone: [phone]

## 2015-07-30 NOTE — MAU Note (Signed)
Pt reports she has been coughing for the last week, states she is having some shortness of breath and chest tightness for the last 2 weeks. States her ribs hurt and she is dizzy at times. Denies fever. Also reports a positive home preg test.

## 2015-07-30 NOTE — MAU Provider Note (Signed)
History     CSN: [id number]  Arrival date and time: 07/30/15 2130   First Provider Initiated Contact with Patient 07/30/15 2221         Chief Complaint  Patient presents with  . Possible Pregnancy  . Near Syncope  . Chest Pain    rib pain   HPI  Julie Malone is a 20 y.o. female. Reports 3 positive pregnancy tests at home today. Denies abdominal pain or vaginal bleeding. Wants to confirm pregnancy.  Also complaints of intermittent dizziness, SOB, & rib/epigastric pain with inhalation x 2 weeks. Denies LOC, headache, fever, cough, chest pain, or palpitations. Some n/v intermittently for the last week. Vomited once yesterday. No diarrhea or constipation. Some heartburn last week, none now.   OB History    Gravida Para Term Preterm AB TAB SAB Ectopic Multiple Living        Past Medical History  Diagnosis Date  . Medical history non-contributory     Past Surgical History  Procedure Laterality Date  . Tonsillectomy    . Wisdom tooth extraction      Family History  Problem Relation Age of Onset  . Diabetes Father   . Diabetes Maternal Grandfather   . Cancer Paternal Grandmother     Social History  Substance Use Topics  . Smoking status: Never Smoker   . Smokeless tobacco: Never Used  . Alcohol Use: No    Allergies: No Known Allergies  Prescriptions prior to admission  Medication Sig Dispense Refill Last Dose  . doxycycline (VIBRAMYCIN) 100 MG capsule Take 1 capsule (100 mg total) by mouth 2 (two) times daily. (Patient not taking: Reported on 07/30/2015) 20 capsule 0 Completed Course at Unknown time  . ferrous sulfate 325 (65 FE) MG tablet Take 1 tablet (325 mg total) by mouth 2 (two) times daily with a meal. (Patient not taking: Reported on 07/30/2015) 30 tablet 0 Not Taking at Unknown time  . ibuprofen (ADVIL,MOTRIN) 600 MG tablet Take 1 tablet (600 mg total) by mouth every 6 (six) hours as needed. (Patient not taking: Reported on 07/30/2015)  30 tablet 1 Not Taking at Unknown time  . medroxyPROGESTERone (PROVERA) 10 MG tablet Take 1 tablet (10 mg total) by mouth 2 (two) times daily. (Patient not taking: Reported on 07/30/2015) 20 tablet 0 Not Taking at Unknown time  . norgestimate-ethinyl estradiol (MONONESSA) 0.25-35 MG-MCG tablet Take 1 tablet by mouth daily. (Patient not taking: Reported on 07/30/2015) 1 Package 11 Not Taking at Unknown time    Review of Systems  Constitutional: Negative.   HENT: Negative.   Respiratory: Positive for shortness of breath (on exertion, intermittently x 2 weeks). Negative for cough, sputum production and wheezing.   Cardiovascular: Positive for chest pain (vs rib pain with deep breaths). Negative for palpitations.  Gastrointestinal: Positive for heartburn, nausea and vomiting. Negative for abdominal pain, diarrhea and constipation.  Genitourinary: Negative.    Physical Exam   Blood pressure 134/69, pulse 110, temperature 99 F (37.2 C), temperature source Oral, resp. rate 18, height  (1.6 m), weight 263 lb (119.296 kg), last menstrual period 06/20/2015, SpO2 100 %.  Physical Exam  Nursing note and vitals reviewed. Constitutional: She is oriented to person, place, and time. She appears well-developed and well-nourished. No distress.  HENT:  Head: Normocephalic and atraumatic.  Eyes: Conjunctivae are normal. Right eye exhibits no discharge. Left eye exhibits no discharge. No scleral icterus.  Neck: Normal range  of motion.  Cardiovascular: Normal rate, regular rhythm and normal heart sounds.   No murmur heard. Respiratory: Effort normal and breath sounds normal. No respiratory distress. She has no wheezes.  GI: Soft. Bowel sounds are normal. There is no tenderness. There is negative Murphy's sign.  Neurological: She is alert and oriented to person, place, and time.  Skin: Skin is warm and dry. She is not diaphoretic.  Psychiatric: She has a normal mood and affect. Her behavior is normal.  Judgment and thought content normal.    MAU Course  Procedures Results for orders placed or performed during the hospital encounter of 07/30/15 (from the past 24 hour(s))  Urinalysis, Routine w reflex microscopic (not [facility])     Status: Abnormal   Collection Time: 07/30/15  9:35 PM  Result Value Ref Range   Color, Urine YELLOW YELLOW   APPearance CLEAR CLEAR   Specific Gravity, Urine 1.020 1.005 - 1.030   pH 5.5 5.0 - 8.0   Glucose, UA NEGATIVE NEGATIVE mg/dL   Hgb urine dipstick SMALL (A) NEGATIVE   Bilirubin Urine NEGATIVE NEGATIVE   Ketones, ur NEGATIVE NEGATIVE mg/dL   Protein, ur NEGATIVE NEGATIVE mg/dL   Nitrite NEGATIVE NEGATIVE   Leukocytes, UA NEGATIVE NEGATIVE  Urine microscopic-add on     Status: Abnormal   Collection Time: 07/30/15  9:35 PM  Result Value Ref Range   Squamous Epithelial / LPF 6-30 (A) NONE SEEN   WBC, UA 0-5 0 - 5 WBC/hpf   RBC / HPF 0-5 0 - 5 RBC/hpf   Bacteria, UA FEW (A) NONE SEEN  Pregnancy, urine POC     Status: None   Collection Time: 07/30/15  9:54 PM  Result Value Ref Range   Preg Test, Ur NEGATIVE NEGATIVE  CBC     Status: Abnormal   Collection Time: 07/30/15  9:55 PM  Result Value Ref Range   WBC 11.6 (H) 4.0 - 10.5 K/uL   RBC 4.66 3.87 - 5.11 MIL/uL   Hemoglobin 10.0 (L) 12.0 - 15.0 g/dL   HCT 16.132.9 (L) 09.636.0 - 04.546.0 %   MCV 70.6 (L) 78.0 - 100.0 fL   MCH 21.5 (L) 26.0 - 34.0 pg   MCHC 30.4 30.0 - 36.0 g/dL   RDW 40.916.2 (H) 81.111.5 - 91.415.5 %   Platelets 409 (H) 150 - 400 K/uL  hCG, quantitative, pregnancy     Status: Abnormal   Collection Time: 07/30/15  9:55 PM  Result Value Ref Range   hCG, Beta Chain, Quant, S 42 (H) <5 mIU/mL  ABO/Rh     Status: None (Preliminary result)   Collection Time: 07/30/15  9:55 PM  Result Value Ref Range   ABO/RH(D) O POS     MDM O2 sat 100% Pt asymptomatic EKG - normal sinus rhythm UPT negative BHCG positive @ 42  Assessment and Plan  A: 1. Positive blood pregnancy test   2. Anemia,  unspecified anemia type     P; Discharge home Pregnancy verification letter & provider list given Rx phenergan Take iron supplements & increase iron in diet If symptoms return or worsen go to ED Discussed reasons to return to MAU  Judeth HornErin Adreonna Yontz 07/30/2015, 10:19 PM

## 2015-07-31 LAB — ABO/RH: ABO/RH(D): O POS

## 2015-08-07 ENCOUNTER — Inpatient Hospital Stay (HOSPITAL_COMMUNITY): Payer: BLUE CROSS/BLUE SHIELD

## 2015-08-07 ENCOUNTER — Encounter (HOSPITAL_COMMUNITY): Payer: Self-pay | Admitting: *Deleted

## 2015-08-07 ENCOUNTER — Inpatient Hospital Stay (HOSPITAL_COMMUNITY)
Admission: AD | Admit: 2015-08-07 | Discharge: 2015-08-08 | Disposition: A | Discharge status: Home / Self Care | Payer: BLUE CROSS/BLUE SHIELD | Source: Ambulatory Visit | Attending: Family Medicine | Admitting: Family Medicine

## 2015-08-07 DIAGNOSIS — O4691 Antepartum hemorrhage, unspecified, first trimester: Secondary | ICD-10-CM | POA: Diagnosis not present

## 2015-08-07 DIAGNOSIS — Z3A00 Weeks of gestation of pregnancy not specified: Secondary | ICD-10-CM | POA: Diagnosis not present

## 2015-08-07 DIAGNOSIS — R102 Pelvic and perineal pain: Secondary | ICD-10-CM | POA: Insufficient documentation

## 2015-08-07 DIAGNOSIS — O3680X0 Pregnancy with inconclusive fetal viability, not applicable or unspecified: Secondary | ICD-10-CM

## 2015-08-07 DIAGNOSIS — O209 Hemorrhage in early pregnancy, unspecified: Secondary | ICD-10-CM

## 2015-08-07 NOTE — MAU Note (Signed)
PT SAYS  SHE STARTED  CRAMPING   AT 2320.     SHE STARTED HAVING VAG BLEEDING   ON Sunday- WHEN SHE WIPES.      LAST TIME VOIDED - SHE SAW  LIGHT PINKISH RED ON TOILET PAPER.Marland Kitchen.    PLANS TO GET PNC-   WITH [facility]INA.       LAST SEX-   SUN

## 2015-08-07 NOTE — MAU Provider Note (Signed)
History     CSN: [id number]  Arrival date and time: 08/07/15 2329   First Provider Initiated Contact with Patient 08/07/15 2356      Chief Complaint  Patient presents with  . Pelvic Pain   Pelvic Pain The patient's primary symptoms include pelvic pain and vaginal bleeding. This is a new problem. Episode onset: about 3 days ago.  The problem occurs intermittently. The problem has been unchanged. Pain severity now: 7/10  The problem affects the left side. Associated symptoms include abdominal pain, nausea and vomiting (x1 yesterday ). Pertinent negatives include no constipation, diarrhea, dysuria, fever, frequency or urgency. The vaginal discharge was bloody. The vaginal bleeding is spotting (only when wiiping ). She has not been passing clots. She has not been passing tissue. Nothing aggravates the symptoms. She has tried NSAIDs for the symptoms. The treatment provided moderate relief. Menstrual history: LMP 07/19/15    Past Medical History  Diagnosis Date  . Medical history non-contributory     Past Surgical History  Procedure Laterality Date  . Tonsillectomy    . Wisdom tooth extraction      Family History  Problem Relation Age of Onset  . Diabetes Father   . Diabetes Maternal Grandfather   . Cancer Paternal Grandmother     Social History  Substance Use Topics  . Smoking status: Never Smoker   . Smokeless tobacco: Never Used  . Alcohol Use: No    Allergies: No Known Allergies  Prescriptions prior to admission  Medication Sig Dispense Refill Last Dose  . ferrous sulfate 325 (65 FE) MG tablet Take 1 tablet (325 mg total) by mouth 2 (two) times daily with a meal. (Patient not taking: Reported on 07/30/2015) 30 tablet 0 Not Taking at Unknown time  . promethazine (PHENERGAN) 25 MG tablet Take 1 tablet (25 mg total) by mouth every 6 (six) hours as needed for nausea or vomiting. 30 tablet 0     Review of Systems  Constitutional: Negative for fever.  Gastrointestinal:  Positive for nausea, vomiting (x1 yesterday ) and abdominal pain. Negative for diarrhea and constipation.  Genitourinary: Positive for pelvic pain. Negative for dysuria, urgency and frequency.   Physical Exam   Blood pressure 133/87, pulse 114, temperature 98.2 F (36.8 C), temperature source Oral, resp. rate 18, height 5\' 3"  (1.6 m), weight 120.373 kg (265 lb 6 oz), last menstrual period 06/20/2015.  Physical Exam  Nursing note and vitals reviewed. Constitutional: She is oriented to person, place, and time. She appears well-developed and well-nourished. No distress.  HENT:  Head: Normocephalic.  Cardiovascular: Normal rate.   Respiratory: Effort normal.  GI: Soft. There is no tenderness. There is no rebound.  Musculoskeletal: Normal range of motion.  Neurological: She is alert and oriented to person, place, and time.  Skin: Skin is warm and dry.  Psychiatric: She has a normal mood and affect.   Results for orders placed or performed during the hospital encounter of 08/07/15 (from the past 24 hour(s))  CBC     Status: Abnormal   Collection Time: 08/08/15 12:03 AM  Result Value Ref Range   WBC 12.3 (H) 4.0 - 10.5 K/uL   RBC 4.56 3.87 - 5.11 MIL/uL   Hemoglobin 9.7 (L) 12.0 - 15.0 g/dL   HCT 21.331.7 (L) 08.636.0 - 57.846.0 %   MCV 69.5 (L) 78.0 - 100.0 fL   MCH 21.3 (L) 26.0 - 34.0 pg   MCHC 30.6 30.0 - 36.0 g/dL   RDW 46.916.2 (H) 62.911.5 -  15.5 %   Platelets 394 150 - 400 K/uL  hCG, quantitative, pregnancy     Status: Abnormal   Collection Time: 08/08/15 12:03 AM  Result Value Ref Range   hCG, Beta Chain, Quant, S 387 (H) <5 mIU/mL   US Ob Comp Less 14 Wks  08/08/2015  CLINICAL DATA:  Pregnant patient in first-trimester pregnancy with vaginal bleeding and cramping. Beta HCG 387. EXAM: OBSTETRIC <14 WK [place] AND TRANSVAGINAL OB US TECHNIQUE: Both transabdominal and transvaginal ultrasound examinations were performed for complete evaluation of the gestation as well as the maternal uterus, adnexal  regions, and pelvic cul-de-sac. Transvaginal technique was performed to assess early pregnancy. COMPARISON:  Pelvic ultrasound 06/11/2015 FINDINGS: Intrauterine gestational sac: Not present Yolk sac:  Not present Embryo:  Not present. Maternal uterus/adnexae: Endometrium measures 9 mm. The previous heterogeneous material is not definitively seen. No endometrial fluid. The right ovary appears normal with normal blood flow. The left ovary appears normal with normal blood flow. There is no pelvic free fluid. IMPRESSION: No intrauterine pregnancy. No sonographic findings of ectopic pregnancy. Findings consistent with pregnancy of unknown location. Given beta HCG of 387, this is likely due to very early stage of pregnancy, and too early to visualize sonographic. Recommend continued trending of beta HCG and sonographic follow-up as indicated. Electronically Signed   By: Rubye Oaks M.D.   On: 08/08/2015 00:50   US Ob Transvaginal  08/08/2015  CLINICAL DATA:  Pregnant patient in first-trimester pregnancy with vaginal bleeding and cramping. Beta HCG 387. EXAM: OBSTETRIC <14 WK [place] AND TRANSVAGINAL OB US TECHNIQUE: Both transabdominal and transvaginal ultrasound examinations were performed for complete evaluation of the gestation as well as the maternal uterus, adnexal regions, and pelvic cul-de-sac. Transvaginal technique was performed to assess early pregnancy. COMPARISON:  Pelvic ultrasound 06/11/2015 FINDINGS: Intrauterine gestational sac: Not present Yolk sac:  Not present Embryo:  Not present. Maternal uterus/adnexae: Endometrium measures 9 mm. The previous heterogeneous material is not definitively seen. No endometrial fluid. The right ovary appears normal with normal blood flow. The left ovary appears normal with normal blood flow. There is no pelvic free fluid. IMPRESSION: No intrauterine pregnancy. No sonographic findings of ectopic pregnancy. Findings consistent with pregnancy of unknown location. Given  beta HCG of 387, this is likely due to very early stage of pregnancy, and too early to visualize sonographic. Recommend continued trending of beta HCG and sonographic follow-up as indicated. Electronically Signed   By: Rubye Oaks M.D.   On: 08/08/2015 00:50     MAU Course  Procedures  MDM   Assessment and Plan   1. Pregnancy of unknown anatomic location   2. Vaginal bleeding in pregnancy, first trimester    DC home Comfort measures reviewed  1st Trimester precautions  Bleeding precautions Ectopic precautions RX: none  Return to MAU as needed FU with OB as planned  Follow-up Information    Follow up with [facility].   Specialty:  Obstetrics and Gynecology   Why:  8:00 AM on Friday 08/10/15    Contact information:   [address] [place] [address] [phone]        Tawnya Crook 08/07/2015, 11:57 PM

## 2015-08-08 DIAGNOSIS — O4691 Antepartum hemorrhage, unspecified, first trimester: Secondary | ICD-10-CM

## 2015-08-08 LAB — CBC
HEMATOCRIT: 31.7 % — AB (ref 36.0–46.0)
Hemoglobin: 9.7 g/dL — ABNORMAL LOW (ref 12.0–15.0)
MCH: 21.3 pg — ABNORMAL LOW (ref 26.0–34.0)
MCHC: 30.6 g/dL (ref 30.0–36.0)
MCV: 69.5 fL — AB (ref 78.0–100.0)
PLATELETS: 394 10*3/uL (ref 150–400)
RBC: 4.56 MIL/uL (ref 3.87–5.11)
RDW: 16.2 % — AB (ref 11.5–15.5)
WBC: 12.3 10*3/uL — AB (ref 4.0–10.5)

## 2015-08-08 LAB — RPR: RPR Ser Ql: NONREACTIVE

## 2015-08-08 LAB — HCG, QUANTITATIVE, PREGNANCY: hCG, Beta Chain, Quant, S: 387 m[IU]/mL — ABNORMAL HIGH (ref <5)

## 2015-08-08 LAB — HIV ANTIBODY (ROUTINE TESTING W REFLEX): HIV Screen 4th Generation wRfx: NONREACTIVE

## 2015-08-08 NOTE — Discharge Instructions (Signed)
First Trimester of Pregnancy The first trimester of pregnancy is from week 1 until the end of week 12 (months 1 through 3). A week after a sperm fertilizes an egg, the egg will implant on the wall of the uterus. This embryo will begin to develop into a baby. Genes from you and your partner are forming the baby. The female genes determine whether the baby is a boy or a girl. At 6-8 weeks, the eyes and face are formed, and the heartbeat can be seen on ultrasound. At the end of 12 weeks, all the baby's organs are formed.  Now that you are pregnant, you will want to do everything you can to have a healthy baby. Two of the most important things are to get good prenatal care and to follow your health care provider's instructions. Prenatal care is all the medical care you receive before the baby's birth. This care will help prevent, find, and treat any problems during the pregnancy and childbirth. BODY CHANGES Your body goes through many changes during pregnancy. The changes vary from woman to woman.   You may gain or lose a couple of pounds at first.  You may feel sick to your stomach (nauseous) and throw up (vomit). If the vomiting is uncontrollable, call your health care provider.  You may tire easily.  You may develop headaches that can be relieved by medicines approved by your health care provider.  You may urinate more often. Painful urination may mean you have a bladder infection.  You may develop heartburn as a result of your pregnancy.  You may develop constipation because certain hormones are causing the muscles that push waste through your intestines to slow down.  You may develop hemorrhoids or swollen, bulging veins (varicose veins).  Your breasts may begin to grow larger and become tender. Your nipples may stick out more, and the tissue that surrounds them (areola) may become darker.  Your gums may bleed and may be sensitive to brushing and flossing.  Dark spots or blotches (chloasma,  mask of pregnancy) may develop on your face. This will likely fade after the baby is born.  Your menstrual periods will stop.  You may have a loss of appetite.  You may develop cravings for certain kinds of food.  You may have changes in your emotions from day to day, such as being excited to be pregnant or being concerned that something may go wrong with the pregnancy and baby.  You may have more vivid and strange dreams.  You may have changes in your hair. These can include thickening of your hair, rapid growth, and changes in texture. Some women also have hair loss during or after pregnancy, or hair that feels dry or thin. Your hair will most likely return to normal after your baby is born. WHAT TO EXPECT AT YOUR PRENATAL VISITS During a routine prenatal visit:  You will be weighed to make sure you and the baby are growing normally.  Your blood pressure will be taken.  Your abdomen will be measured to track your baby's growth.  The fetal heartbeat will be listened to starting around week 10 or 12 of your pregnancy.  Test results from any previous visits will be discussed. Your health care provider may ask you:  How you are feeling.  If you are feeling the baby move.  If you have had any abnormal symptoms, such as leaking fluid, bleeding, severe headaches, or abdominal cramping.  If you are using any tobacco products,   including cigarettes, chewing tobacco, and electronic cigarettes.  If you have any questions. Other tests that may be performed during your first trimester include:  Blood tests to find your blood type and to check for the presence of any previous infections. They will also be used to check for low iron levels (anemia) and Rh antibodies. Later in the pregnancy, blood tests for diabetes will be done along with other tests if problems develop.  Urine tests to check for infections, diabetes, or protein in the urine.  An ultrasound to confirm the proper growth  and development of the baby.  An amniocentesis to check for possible genetic problems.  Fetal screens for spina bifida and Down syndrome.  You may need other tests to make sure you and the baby are doing well.  HIV (human immunodeficiency virus) testing. Routine prenatal testing includes screening for HIV, unless you choose not to have this test. HOME CARE INSTRUCTIONS  Medicines  Follow your health care provider's instructions regarding medicine use. Specific medicines may be either safe or unsafe to take during pregnancy.  Take your prenatal vitamins as directed.  If you develop constipation, try taking a stool softener if your health care provider approves. Diet  Eat regular, well-balanced meals. Choose a variety of foods, such as meat or vegetable-based protein, fish, milk and low-fat dairy products, vegetables, fruits, and whole grain breads and cereals. Your health care provider will help you determine the amount of weight gain that is right for you.  Avoid raw meat and uncooked cheese. These carry germs that can cause birth defects in the baby.  Eating four or five small meals rather than three large meals a day may help relieve nausea and vomiting. If you start to feel nauseous, eating a few soda crackers can be helpful. Drinking liquids between meals instead of during meals also seems to help nausea and vomiting.  If you develop constipation, eat more high-fiber foods, such as fresh vegetables or fruit and whole grains. Drink enough fluids to keep your urine clear or pale yellow. Activity and Exercise  Exercise only as directed by your health care provider. Exercising will help you:  Control your weight.  Stay in shape.  Be prepared for labor and delivery.  Experiencing pain or cramping in the lower abdomen or low back is a good sign that you should stop exercising. Check with your health care provider before continuing normal exercises.  Try to avoid standing for long  periods of time. Move your legs often if you must stand in one place for a long time.  Avoid heavy lifting.  Wear low-heeled shoes, and practice good posture.  You may continue to have sex unless your health care provider directs you otherwise. Relief of Pain or Discomfort  Wear a good support bra for breast tenderness.   Take warm sitz baths to soothe any pain or discomfort caused by hemorrhoids. Use hemorrhoid cream if your health care provider approves.   Rest with your legs elevated if you have leg cramps or low back pain.  If you develop varicose veins in your legs, wear support hose. Elevate your feet for 15 minutes, 3-4 times a day. Limit salt in your diet. Prenatal Care  Schedule your prenatal visits by the twelfth week of pregnancy. They are usually scheduled monthly at first, then more often in the last 2 months before delivery.  Write down your questions. Take them to your prenatal visits.  Keep all your prenatal visits as directed by your   health care provider. Safety  Wear your seat belt at all times when driving.  Make a list of emergency phone numbers, including numbers for family, friends, the hospital, and police and fire departments. General Tips  Ask your health care provider for a referral to a local prenatal education class. Begin classes no later than at the beginning of month 6 of your pregnancy.  Ask for help if you have counseling or nutritional needs during pregnancy. Your health care provider can offer advice or refer you to specialists for help with various needs.  Do not use hot tubs, steam rooms, or saunas.  Do not douche or use tampons or scented sanitary pads.  Do not cross your legs for long periods of time.  Avoid cat litter boxes and soil used by cats. These carry germs that can cause birth defects in the baby and possibly loss of the fetus by miscarriage or stillbirth.  Avoid all smoking, herbs, alcohol, and medicines not prescribed by  your health care provider. Chemicals in these affect the formation and growth of the baby.  Do not use any tobacco products, including cigarettes, chewing tobacco, and electronic cigarettes. If you need help quitting, ask your health care provider. You may receive counseling support and other resources to help you quit.  Schedule a dentist appointment. At home, brush your teeth with a soft toothbrush and be gentle when you floss. SEEK MEDICAL CARE IF:   You have dizziness.  You have mild pelvic cramps, pelvic pressure, or nagging pain in the abdominal area.  You have persistent nausea, vomiting, or diarrhea.  You have a bad smelling vaginal discharge.  You have pain with urination.  You notice increased swelling in your face, hands, legs, or ankles. SEEK IMMEDIATE MEDICAL CARE IF:   You have a fever.  You are leaking fluid from your vagina.  You have spotting or bleeding from your vagina.  You have severe abdominal cramping or pain.  You have rapid weight gain or loss.  You vomit blood or material that looks like coffee grounds.  You are exposed to German measles and have never had them.  You are exposed to fifth disease or chickenpox.  You develop a severe headache.  You have shortness of breath.  You have any kind of trauma, such as from a fall or a car accident.   This information is not intended to replace advice given to you by your health care provider. Make sure you discuss any questions you have with your health care provider.   Document Released: 04/01/2001 Document Revised: 04/28/2014 Document Reviewed: 02/15/2013 Elsevier Interactive Patient Education 2016 [employer]  

## 2015-08-10 ENCOUNTER — Encounter: Payer: Self-pay | Admitting: Family Medicine

## 2015-08-10 ENCOUNTER — Other Ambulatory Visit: Payer: BLUE CROSS/BLUE SHIELD

## 2015-08-10 DIAGNOSIS — O3680X0 Pregnancy with inconclusive fetal viability, not applicable or unspecified: Secondary | ICD-10-CM

## 2015-08-10 LAB — HCG, QUANTITATIVE, PREGNANCY: hCG, Beta Chain, Quant, S: 535 m[IU]/mL — ABNORMAL HIGH (ref <5)

## 2015-08-10 NOTE — Progress Notes (Signed)
Pt in for repeat hcg. She has some spotting. Denies pain. Reviewed results with Dr. Adrian BlackwaterStinson who recommended that patient return Monday for a repeat hcg level and do ultrasound once level reaches 1500. Discussed plan with patient and she is agreeable to this. Advised her that if she develops pain or bleeding to go to MAU.

## 2015-08-13 ENCOUNTER — Other Ambulatory Visit: Payer: Self-pay | Admitting: Family Medicine

## 2015-08-13 ENCOUNTER — Telehealth: Payer: Self-pay | Admitting: Obstetrics & Gynecology

## 2015-08-13 ENCOUNTER — Other Ambulatory Visit: Payer: Self-pay

## 2015-08-13 ENCOUNTER — Other Ambulatory Visit: Payer: BLUE CROSS/BLUE SHIELD

## 2015-08-13 DIAGNOSIS — O209 Hemorrhage in early pregnancy, unspecified: Secondary | ICD-10-CM

## 2015-08-13 DIAGNOSIS — O3680X0 Pregnancy with inconclusive fetal viability, not applicable or unspecified: Secondary | ICD-10-CM

## 2015-08-13 MED ORDER — ACYCLOVIR 400 MG PO TABS: 400.0000 mg | ORAL_TABLET | Freq: Two times a day (BID) | ORAL | Status: DC

## 2015-08-13 NOTE — Telephone Encounter (Signed)
To Calling to get test results

## 2015-08-13 NOTE — Telephone Encounter (Signed)
Patient results are not back yet. We will call her once they come back. I have schedule her for U/S on 08/17/2015 arrive at 7:45am for 8:00am appt.

## 2015-08-14 LAB — HCG, QUANTITATIVE, PREGNANCY: hCG, Beta Chain, Quant, S: 565.9 m[IU]/mL — ABNORMAL HIGH

## 2015-08-14 NOTE — Telephone Encounter (Signed)
Per Dr. Shawnie PonsPratt, pt needs to be contacted for a STAT beta lab tomorrow and to make sure that she is seen by a provider.  Contacted pt and informed pt to come in tomorrow for STAT beta quant level tomorrow, 08/15/15 @ 11.  I explained to pt that she will need to be here at the office for at least two hours.  Pt stated understanding and that she will be here tomorrow as requested.

## 2015-08-15 ENCOUNTER — Other Ambulatory Visit (INDEPENDENT_AMBULATORY_CARE_PROVIDER_SITE_OTHER): Payer: BLUE CROSS/BLUE SHIELD | Admitting: Obstetrics & Gynecology

## 2015-08-15 ENCOUNTER — Encounter: Payer: Self-pay | Admitting: Obstetrics & Gynecology

## 2015-08-15 ENCOUNTER — Encounter (HOSPITAL_COMMUNITY): Payer: Self-pay | Admitting: Emergency Medicine

## 2015-08-15 DIAGNOSIS — E669 Obesity, unspecified: Secondary | ICD-10-CM | POA: Diagnosis not present

## 2015-08-15 DIAGNOSIS — O99211 Obesity complicating pregnancy, first trimester: Secondary | ICD-10-CM | POA: Diagnosis not present

## 2015-08-15 DIAGNOSIS — R102 Pelvic and perineal pain: Secondary | ICD-10-CM | POA: Diagnosis not present

## 2015-08-15 DIAGNOSIS — O209 Hemorrhage in early pregnancy, unspecified: Secondary | ICD-10-CM | POA: Diagnosis present

## 2015-08-15 DIAGNOSIS — O0281 Inappropriate change in quantitative human chorionic gonadotropin (hCG) in early pregnancy: Secondary | ICD-10-CM

## 2015-08-15 DIAGNOSIS — Z79899 Other long term (current) drug therapy: Secondary | ICD-10-CM | POA: Insufficient documentation

## 2015-08-15 DIAGNOSIS — O3680X0 Pregnancy with inconclusive fetal viability, not applicable or unspecified: Secondary | ICD-10-CM

## 2015-08-15 DIAGNOSIS — Z3A08 8 weeks gestation of pregnancy: Secondary | ICD-10-CM | POA: Diagnosis not present

## 2015-08-15 DIAGNOSIS — O9989 Other specified diseases and conditions complicating pregnancy, childbirth and the puerperium: Secondary | ICD-10-CM | POA: Insufficient documentation

## 2015-08-15 LAB — HCG, QUANTITATIVE, PREGNANCY
HCG, BETA CHAIN, QUANT, S: 679 m[IU]/mL — AB (ref <5)
HCG, BETA CHAIN, QUANT, S: 748 m[IU]/mL — AB (ref <5)

## 2015-08-15 LAB — COMPREHENSIVE METABOLIC PANEL
ALBUMIN: 3 g/dL — AB (ref 3.5–5.0)
ALT: 30 U/L (ref 14–54)
ANION GAP: 9 (ref 5–15)
AST: 21 U/L (ref 15–41)
Alkaline Phosphatase: 63 U/L (ref 38–126)
BUN: 11 mg/dL (ref 6–20)
CHLORIDE: 107 mmol/L (ref 101–111)
CO2: 24 mmol/L (ref 22–32)
Calcium: 9.5 mg/dL (ref 8.9–10.3)
Creatinine, Ser: 0.71 mg/dL (ref 0.44–1.00)
GFR calc non Af Amer: >60 mL/min (ref >60)
GLUCOSE: 97 mg/dL (ref 65–99)
Potassium: 4.1 mmol/L (ref 3.5–5.1)
SODIUM: 140 mmol/L (ref 135–145)
Total Bilirubin: 0.3 mg/dL (ref 0.3–1.2)
Total Protein: 6.9 g/dL (ref 6.5–8.1)

## 2015-08-15 LAB — CBC WITH DIFFERENTIAL/PLATELET
BASOS ABS: 0 10*3/uL (ref 0.0–0.1)
BASOS PCT: 0 %
EOS ABS: 0.2 10*3/uL (ref 0.0–0.7)
Eosinophils Relative: 2 %
HCT: 32.6 % — ABNORMAL LOW (ref 36.0–46.0)
HEMOGLOBIN: 9.9 g/dL — AB (ref 12.0–15.0)
LYMPHS PCT: 36 %
Lymphs Abs: 4.2 10*3/uL — ABNORMAL HIGH (ref 0.7–4.0)
MCH: 21.4 pg — AB (ref 26.0–34.0)
MCHC: 30.4 g/dL (ref 30.0–36.0)
MCV: 70.6 fL — ABNORMAL LOW (ref 78.0–100.0)
MONO ABS: 0.7 10*3/uL (ref 0.1–1.0)
Monocytes Relative: 6 %
NEUTROS PCT: 56 %
Neutro Abs: 6.6 10*3/uL (ref 1.7–7.7)
PLATELETS: 384 10*3/uL (ref 150–400)
RBC: 4.62 MIL/uL (ref 3.87–5.11)
RDW: 16.7 % — ABNORMAL HIGH (ref 11.5–15.5)
WBC: 11.7 10*3/uL — ABNORMAL HIGH (ref 4.0–10.5)

## 2015-08-15 LAB — URINALYSIS, ROUTINE W REFLEX MICROSCOPIC
Bilirubin Urine: NEGATIVE
GLUCOSE, UA: NEGATIVE mg/dL
Hgb urine dipstick: NEGATIVE
Ketones, ur: NEGATIVE mg/dL
LEUKOCYTES UA: NEGATIVE
NITRITE: NEGATIVE
PH: 6 (ref 5.0–8.0)
Protein, ur: NEGATIVE mg/dL
SPECIFIC GRAVITY, URINE: 1.021 (ref 1.005–1.030)

## 2015-08-15 LAB — I-STAT BETA HCG BLOOD, ED (MC, WL, AP ONLY): I-stat hCG, quantitative: 552.1 m[IU]/mL — ABNORMAL HIGH (ref <5)

## 2015-08-15 NOTE — ED Notes (Signed)
Pt. reports low abdominal cramping with light spotting onset this evening , denies nausea or vomitting / no diarrhea , denies fever or chills, pt. stated she is approx. [redacted] weeks pregnant with no prenatal visit .

## 2015-08-15 NOTE — Progress Notes (Signed)
[name]  is a 20 y.o. G1P0000 at [redacted]w[redacted]d who presents to the [facility] today for follow-up quant hCG.  Has been followed since 08/08/15, u/s did not show anything. She denies any pain, vaginal bleeding or fever today.   OB History  Gravida Para Term Preterm AB SAB TAB Ectopic Multiple Living     # Outcome Date GA Lbr Len/2nd Weight Sex Delivery Anes PTL Lv  1 Current               Past Medical History  Diagnosis Date  . Medical history non-contributory      LMP 06/20/2015  CONSTITUTIONAL: Well-developed, well-nourished female in no acute distress.  ENT: External right and left ear normal.  EYES: EOM intact, conjunctivae normal.  MUSCULOSKELETAL: Normal range of motion.  CARDIOVASCULAR: Regular heart rate RESPIRATORY: Normal effort NEUROLOGICAL: Alert and oriented to person, place, and time.  SKIN: Skin is warm and dry. No rash noted. Not diaphoretic. No erythema. No pallor. PSYCH: Normal mood and affect. Normal behavior. Normal judgment and thought content.  Results for Julie Malone (MRN [id number]) as of 08/15/2015 13:23  Ref. Range 08/08/2015 00:03 08/10/2015 08:30 08/13/2015 14:00 08/15/2015 11:25  HCG, Beta Chain, Quant, S Latest Ref Range: <5 mIU/mL 387 (H) 535 (H) 565.9 (H) 679 (H)    US Ob Comp Less 14 Wks  08/08/2015  CLINICAL DATA:  Pregnant patient in first-trimester pregnancy with vaginal bleeding and cramping. Beta HCG 387. EXAM: OBSTETRIC <14 WK [place] AND TRANSVAGINAL OB US TECHNIQUE: Both transabdominal and transvaginal ultrasound examinations were performed for complete evaluation of the gestation as well as the maternal uterus, adnexal regions, and pelvic cul-de-sac. Transvaginal technique was performed to assess early pregnancy. COMPARISON:  Pelvic ultrasound 06/11/2015 FINDINGS: Intrauterine gestational sac: Not present Yolk sac:  Not present Embryo:  Not present. Maternal uterus/adnexae: Endometrium measures 9 mm. The previous  heterogeneous material is not definitively seen. No endometrial fluid. The right ovary appears normal with normal blood flow. The left ovary appears normal with normal blood flow. There is no pelvic free fluid. IMPRESSION: No intrauterine pregnancy. No sonographic findings of ectopic pregnancy. Findings consistent with pregnancy of unknown location. Given beta HCG of 387, this is likely due to very early stage of pregnancy, and too early to visualize sonographic. Recommend continued trending of beta HCG and sonographic follow-up as indicated. Electronically Signed   By: Rubye Oaks M.D.   On: 08/08/2015 00:50   US Ob Transvaginal  08/08/2015  CLINICAL DATA:  Pregnant patient in first-trimester pregnancy with vaginal bleeding and cramping. Beta HCG 387. EXAM: OBSTETRIC <14 WK [place] AND TRANSVAGINAL OB US TECHNIQUE: Both transabdominal and transvaginal ultrasound examinations were performed for complete evaluation of the gestation as well as the maternal uterus, adnexal regions, and pelvic cul-de-sac. Transvaginal technique was performed to assess early pregnancy. COMPARISON:  Pelvic ultrasound 06/11/2015 FINDINGS: Intrauterine gestational sac: Not present Yolk sac:  Not present Embryo:  Not present. Maternal uterus/adnexae: Endometrium measures 9 mm. The previous heterogeneous material is not definitively seen. No endometrial fluid. The right ovary appears normal with normal blood flow. The left ovary appears normal with normal blood flow. There is no pelvic free fluid. IMPRESSION: No intrauterine pregnancy. No sonographic findings of ectopic pregnancy. Findings consistent with pregnancy of unknown location. Given beta HCG of 387, this is likely due to very early stage of pregnancy, and too early to visualize sonographic. Recommend continued trending of  beta HCG and sonographic follow-up as indicated. Electronically Signed   By: Rubye OaksMelanie  Ehinger M.D.   On: 08/08/2015 00:50    A:Inappropriate rise in quant hCG  over several values  P: Counseled about concern about ectopic pregnancy, recommended treatment with Methotrexate. Patient declined treatment; understands concern about risk of rupture, internal bleeding which can be life-threatening.   Ectopic precautions discussed Patient will return on 08/17/15 for scheduled follow-up US and will also have HCG check in clinic Patient may return to MAU as needed or if her condition were to change or worsen   Tereso NewcomerUgonna A Davyn Elsasser, MD 08/15/2015 1:22 PM

## 2015-08-15 NOTE — Patient Instructions (Signed)
Ectopic Pregnancy °An ectopic pregnancy is when the fertilized egg attaches (implants) outside the uterus. Most ectopic pregnancies occur in the fallopian tube. Rarely do ectopic pregnancies occur on the ovary, intestine, pelvis, or cervix. In an ectopic pregnancy, the fertilized egg does not have the ability to develop into a normal, healthy baby.  °A ruptured ectopic pregnancy is one in which the fallopian tube gets torn or bursts and results in internal bleeding. Often there is intense abdominal pain, and sometimes, vaginal bleeding. Having an ectopic pregnancy can be life threatening. If left untreated, this dangerous condition can lead to a blood transfusion, abdominal surgery, or even death. °CAUSES  °Damage to the fallopian tubes is the suspected cause in most ectopic pregnancies.  °RISK FACTORS °Depending on your circumstances, the risk of having an ectopic pregnancy will vary. The level of risk can be divided into three categories. °High Risk °· You have gone through infertility treatment. °· You have had a previous ectopic pregnancy. °· You have had previous tubal surgery. °· You have had previous surgery to have the fallopian tubes tied (tubal ligation). °· You have tubal problems or diseases. °· You have been exposed to DES. DES is a medicine that was used until 1971 and had effects on babies whose mothers took the medicine. °· You become pregnant while using an intrauterine device (IUD) for birth control.  °Moderate Risk °· You have a history of infertility. °· You have a history of a sexually transmitted infection (STI). °· You have a history of pelvic inflammatory disease (PID). °· You have scarring from endometriosis. °· You have multiple sexual partners. °· You smoke.  °Low Risk °· You have had previous pelvic surgery. °· You use vaginal douching. °· You became sexually active before 21 years of age. °SIGNS AND SYMPTOMS  °An ectopic pregnancy should be suspected in anyone who has missed a period and  has abdominal pain or bleeding. °· You may experience normal pregnancy symptoms, such as: °· Nausea. °· Tiredness. °· Breast tenderness. °· Other symptoms may include: °· Pain with intercourse. °· Irregular vaginal bleeding or spotting. °· Cramping or pain on one side or in the lower abdomen. °· Fast heartbeat. °· Passing out while having a bowel movement. °· Symptoms of a ruptured ectopic pregnancy and internal bleeding may include: °· Sudden, severe pain in the abdomen and pelvis. °· Dizziness or fainting. °· Pain in the shoulder area. °DIAGNOSIS  °Tests that may be performed include: °· A pregnancy test. °· An ultrasound test. °· Testing the specific level of pregnancy hormone in the bloodstream. °· Taking a sample of uterus tissue (dilation and curettage, D&C). °· Surgery to perform a visual exam of the inside of the abdomen using a thin, lighted tube with a tiny camera on the end (laparoscope). °TREATMENT  °An injection of a medicine called methotrexate may be given. This medicine causes the pregnancy tissue to be absorbed. It is given if: °· The diagnosis is made early. °· The fallopian tube has not ruptured. °· You are considered to be a good candidate for the medicine. °Usually, pregnancy hormone blood levels are checked after methotrexate treatment. This is to be sure the medicine is effective. It may take 4-6 weeks for the pregnancy to be absorbed (though most pregnancies will be absorbed by 3 weeks). °Surgical treatment may be needed. A laparoscope may be used to remove the pregnancy tissue. If severe internal bleeding occurs, a cut (incision) may be made in the lower abdomen (laparotomy), and the ectopic   pregnancy is removed. This stops the bleeding. Part of the fallopian tube, or the whole tube, may be removed as well (salpingectomy). After surgery, pregnancy hormone tests may be done to be sure there is no pregnancy tissue left. You may receive a Rho (D) immune globulin shot if you are Rh negative and  the father is Rh positive, or if you do not know the Rh type of the father. This is to prevent problems with any future pregnancy. °SEEK IMMEDIATE MEDICAL CARE IF:  °You have any symptoms of an ectopic pregnancy. This is a medical emergency. °MAKE SURE YOU: °· Understand these instructions. °· Will watch your condition. °· Will get help right away if you are not doing well or get worse. °  °This information is not intended to replace advice given to you by your health care provider. Make sure you discuss any questions you have with your health care provider. °  °Document Released: 05/15/2004 Document Revised: 04/28/2014 Document Reviewed: 11/04/2012 °Elsevier Interactive Patient Education ©2016 [employer] Inc[employer] °Methotrexate Treatment for an Ectopic Pregnancy °Methotrexate is a medicine that treats ectopic pregnancy by stopping the growth of the fertilized egg. It also helps your body absorb tissue from the egg. This takes between 2 weeks and 6 weeks. Most ectopic pregnancies can be successfully treated with methotrexate if they are detected early enough. °LET YOUR HEALTH CARE PROVIDER KNOW ABOUT: °· Any allergies you have. °· All medicines you are taking, including vitamins, herbs, eye drops, creams, and over-the-counter medicines. °· Medical conditions you have. °RISKS AND COMPLICATIONS °Generally, this is a safe treatment. However, as with any treatment, problems can occur. Possible problems or side effects include: °· Nausea. °· Vomiting. °· Diarrhea. °· Abdominal cramping. °· Mouth sores. °· Increased vaginal bleeding or spotting.   °· Swelling or irritation of the lining of your lungs (pneumonitis).  °· Failed treatment and continuation of the pregnancy.   °· Liver damage. °· Hair loss. °There is still a risk of the ectopic pregnancy rupturing while using the methotrexate. °BEFORE THE PROCEDURE °Before you take the medicine:  °· Liver tests, kidney tests, and a complete blood test are performed. °· Blood tests  are performed to measure the pregnancy hormone levels and to determine your blood type. °· If you are Rh-negative and the father is Rh-positive or his Rh type is not known, you will be given a Rho (D) immune globulin shot. °PROCEDURE  °There are two methods that your health care provider may use to prescribe methotrexate. One method involves a single dose or injection of the medicine. Another method involves a series of doses given through several injections.  °AFTER THE PROCEDURE °· You may have some abdominal cramping, vaginal bleeding, and fatigue in the first few days after taking methotrexate. °· Blood tests will be taken for several weeks to check the pregnancy hormone levels. The blood tests are performed until there is no more pregnancy hormone detected in the blood. °  °This information is not intended to replace advice given to you by your health care provider. Make sure you discuss any questions you have with your health care provider. °  °Document Released: 04/01/2001 Document Revised: 04/28/2014 Document Reviewed: 01/24/2013 °Elsevier Interactive Patient Education ©2016 Elsevier Inc. ° °

## 2015-08-16 ENCOUNTER — Emergency Department (HOSPITAL_COMMUNITY): Payer: BLUE CROSS/BLUE SHIELD

## 2015-08-16 ENCOUNTER — Emergency Department (HOSPITAL_COMMUNITY)
Admission: EM | Admit: 2015-08-16 | Discharge: 2015-08-16 | Disposition: A | Discharge status: Home / Self Care | Payer: BLUE CROSS/BLUE SHIELD | Attending: Emergency Medicine | Admitting: Emergency Medicine

## 2015-08-16 DIAGNOSIS — O26899 Other specified pregnancy related conditions, unspecified trimester: Secondary | ICD-10-CM

## 2015-08-16 DIAGNOSIS — O209 Hemorrhage in early pregnancy, unspecified: Secondary | ICD-10-CM | POA: Diagnosis not present

## 2015-08-16 DIAGNOSIS — O4690 Antepartum hemorrhage, unspecified, unspecified trimester: Secondary | ICD-10-CM

## 2015-08-16 DIAGNOSIS — R102 Pelvic and perineal pain: Secondary | ICD-10-CM

## 2015-08-16 HISTORY — DX: Obesity, unspecified: E66.9

## 2015-08-16 LAB — BETA HCG QUANT (REF LAB): Beta hCG, Tumor Marker: 624.8 m[IU]/mL — ABNORMAL HIGH (ref <5.0)

## 2015-08-16 NOTE — ED Notes (Signed)
Pt to US.

## 2015-08-16 NOTE — ED Provider Notes (Signed)
CSN: [id number]     Arrival date & time 08/15/15  2014 History   By signing my name below, I, Arlan Organ, attest that this documentation has been prepared under the direction and in the presence of Gilda Crease, MD.  Electronically Signed: Arlan Organ, ED Scribe. 08/16/2015. 12:30 AM.   Chief Complaint  Patient presents with  . Abdominal Cramping  . Vaginal Bleeding   The history is provided by the patient. No language interpreter was used.    HPI Comments: Julie Malone, currently [redacted] weeks gestation is a 20 y.o. female without any pertinent past medical history who presents to the Emergency Department complaining of constant, ongoing lower abdominal pain x 1 day. Pain is described as cramping. She also reports light vaginal spotting noted this evening. No aggravating or alleviating factors reported. No OTC medications or home remedies attempted prior to arrival. No recent fever, chills, nausea, vomiting, or diarrhea. [name] states she followed at the [facility] clinic today and was told to come back in 2 days for an ultrasound. No known allergies to medications.  PCP: No PCP Per Patient    Past Medical History  Diagnosis Date  . Medical history non-contributory   . Obesity    Past Surgical History  Procedure Laterality Date  . Tonsillectomy    . Wisdom tooth extraction     Family History  Problem Relation Age of Onset  . Diabetes Father   . Diabetes Maternal Grandfather   . Cancer Paternal Grandmother    Social History  Substance Use Topics  . Smoking status: Never Smoker   . Smokeless tobacco: Never Used  . Alcohol Use: No   OB History    Gravida Para Term Preterm AB TAB SAB Ectopic Multiple Living       Review of Systems  Constitutional: Negative for fever and chills.  Respiratory: Negative for cough and shortness of breath.   Cardiovascular: Negative for chest pain.  Gastrointestinal: Positive for abdominal pain. Negative for  nausea, vomiting and diarrhea.  Genitourinary: Positive for vaginal bleeding.  Neurological: Negative for headaches.  Psychiatric/Behavioral: Negative for confusion.  All other systems reviewed and are negative.     Allergies  Review of patient's allergies indicates no known allergies.  Home Medications   Prior to Admission medications   Medication Sig Start Date End Date Taking? Authorizing Provider  ferrous sulfate 325 (65 FE) MG tablet Take 1 tablet (325 mg total) by mouth 2 (two) times daily with a meal. 06/11/15  Yes Duane Lope, NP  Prenatal Vit-Fe Fumarate-FA (PRENATAL MULTIVITAMIN) TABS tablet Take 1 tablet by mouth daily at 12 noon.   Yes Historical Provider, MD   Triage Vitals: BP 147/93 mmHg  Pulse 110  Temp(Src) 98.2 F (36.8 C) (Oral)  Resp 20  Ht  (1.6 m)  Wt 267 lb 3 oz (121.195 kg)  BMI 47.34 kg/m2  SpO2 98%  LMP 06/20/2015   Physical Exam  Constitutional: She is oriented to person, place, and time. She appears well-developed and well-nourished. No distress.  HENT:  Head: Normocephalic and atraumatic.  Right Ear: Hearing normal.  Left Ear: Hearing normal.  Nose: Nose normal.  Mouth/Throat: Oropharynx is clear and moist and mucous membranes are normal.  Eyes: Conjunctivae and EOM are normal. Pupils are equal, round, and reactive to light.  Neck: Normal range of motion. Neck supple.  Cardiovascular: Regular rhythm, S1 normal and S2 normal.  Exam  reveals no gallop and no friction rub.   No murmur heard. Pulmonary/Chest: Effort normal and breath sounds normal. No respiratory distress. She exhibits no tenderness.  Abdominal: Soft. Normal appearance and bowel sounds are normal. There is no hepatosplenomegaly. There is no tenderness. There is no rebound, no guarding, no tenderness at McBurney's point and negative Murphy's sign. No hernia.  Musculoskeletal: Normal range of motion.  Neurological: She is alert and oriented to person, place, and time. She  has normal strength. No cranial nerve deficit or sensory deficit. Coordination normal. GCS eye subscore is 4. GCS verbal subscore is 5. GCS motor subscore is 6.  Skin: Skin is warm, dry and intact. No rash noted. No cyanosis.  Psychiatric: She has a normal mood and affect. Her speech is normal and behavior is normal. Thought content normal.  Nursing note and vitals reviewed.   ED Course  Procedures (including critical care time)  DIAGNOSTIC STUDIES: Oxygen Saturation is 98% on RA, Normal by my interpretation.    COORDINATION OF CARE: 12:20 AM- Will order blod work and urinalysis. Discussed treatment plan with pt at bedside and pt agreed to plan.     Labs Review Labs Reviewed  CBC WITH DIFFERENTIAL/PLATELET - Abnormal; Notable for the following:    WBC 11.7 (*)    Hemoglobin 9.9 (*)    HCT 32.6 (*)    MCV 70.6 (*)    MCH 21.4 (*)    RDW 16.7 (*)    Lymphs Abs 4.2 (*)    All other components within normal limits  COMPREHENSIVE METABOLIC PANEL - Abnormal; Notable for the following:    Albumin 3.0 (*)    All other components within normal limits  URINALYSIS, ROUTINE W REFLEX MICROSCOPIC (NOT AT [facility]) - Abnormal; Notable for the following:    APPearance CLOUDY (*)    All other components within normal limits  HCG, QUANTITATIVE, PREGNANCY - Abnormal; Notable for the following:    hCG, Beta Chain, Quant, S 748 (*)    All other components within normal limits  I-STAT BETA HCG BLOOD, ED ([facility], [facility], [facility] ONLY) - Abnormal; Notable for the following:    I-stat hCG, quantitative 552.1 (*)    All other components within normal limits    Imaging Review US Ob Comp Less 14 Wks  08/16/2015  CLINICAL DATA:  Acute onset of right pelvic pain and bloating. Vaginal spotting and cramping. Initial encounter. EXAM: OBSTETRIC <14 WK [place] AND TRANSVAGINAL OB US TECHNIQUE: Both transabdominal and transvaginal ultrasound examinations were performed for complete evaluation of the gestation as well as the  maternal uterus, adnexal regions, and pelvic cul-de-sac. Transvaginal technique was performed to assess early pregnancy. COMPARISON:  Pelvic ultrasound performed 08/08/2015 FINDINGS: Intrauterine gestational sac: None seen. Yolk sac:  N/A Embryo:  N/A Subchorionic hemorrhage:  None visualized. Maternal uterus/adnexae: The uterus is unremarkable in appearance. A tiny cyst is noted at the lower uterine segment. The ovaries are within normal limits. The right ovary measures 3.3 x 2.2 x 2.9 cm, while the left ovary measures 3.9 x 2.2 x 2.8 cm. No suspicious adnexal masses are seen; there is no evidence for ovarian torsion. Trace free fluid is seen within the pelvic cul-de-sac. IMPRESSION: No definite intrauterine gestational sac seen. This remains within normal limits given the quantitative beta HCG level of 748. If the quantitative beta HCG level continues to trend upward, follow-up pelvic ultrasound could be performed in 2 weeks. Electronically Signed   By: Roanna Raider M.D.   On:  08/16/2015 01:29   [place] Ob Transvaginal  08/16/2015  CLINICAL DATA:  Acute onset of right pelvic pain and bloating. Vaginal spotting and cramping. Initial encounter. EXAM: OBSTETRIC <14 WK US AND TRANSVAGINAL OB US TECHNIQUE: Both transabdominal and transvaginal ultrasound examinations were performed for complete evaluation of the gestation as well as the maternal uterus, adnexal regions, and pelvic cul-de-sac. Transvaginal technique was performed to assess early pregnancy. COMPARISON:  Pelvic ultrasound performed 08/08/2015 FINDINGS: Intrauterine gestational sac: None seen. Yolk sac:  N/A Embryo:  N/A Subchorionic hemorrhage:  None visualized. Maternal uterus/adnexae: The uterus is unremarkable in appearance. A tiny cyst is noted at the lower uterine segment. The ovaries are within normal limits. The right ovary measures 3.3 x 2.2 x 2.9 cm, while the left ovary measures 3.9 x 2.2 x 2.8 cm. No suspicious adnexal masses are seen; there is  no evidence for ovarian torsion. Trace free fluid is seen within the pelvic cul-de-sac. IMPRESSION: No definite intrauterine gestational sac seen. This remains within normal limits given the quantitative beta HCG level of 748. If the quantitative beta HCG level continues to trend upward, follow-up pelvic ultrasound could be performed in 2 weeks. Electronically Signed   By: Roanna RaiderJeffery  Chang M.D.   On: 08/16/2015 01:29   I have personally reviewed and evaluated these images and lab results as part of my medical decision-making.   EKG Interpretation None      MDM   Final diagnoses:  Vaginal bleeding during pregnancy, antepartum  Pelvic pain during pregnancy    Patient presents to the emergency department for evaluation of pelvic pain and vaginal spotting. Patient is pregnant. She has been followed by OB/GYN for pregnancy of unknown location. HCG levels have been low and have been increasing, but not appropriately doubling. She had an ultrasound performed on the 18th that did not show IUP or sequela of ectopic.  Pelvic exam did not show any bleeding. Cervix is closed. She did not have any adnexal masses or tenderness. HCG has continued to rise, although at a rate less than doubling every 48 hours. Ultrasound was performed tonight to evaluate for possibility of signs of ectopic pregnancy. No adnexal abnormalities were noted. IUP not seen, but this would not be unusual with a hCG level of 748. Patient is Rh+.  Discussed with Dr. Despina HiddenEure, on call for OB/GYN. After discussing the patient's examination, lab values, ultrasound findings recommends having the patient [facility] May [facility] and have hCG redrawn. Patient was counseled to return to the ER immediately for heavy bleeding, increasing pain, passing out.  I personally performed the services described in this documentation, which was scribed in my presence. The recorded information has been reviewed and is  accurate.    Gilda Creasehristopher J Ahmani Daoud, MD 08/16/15 [phone]

## 2015-08-16 NOTE — Discharge Instructions (Signed)
Go directly to [facility] if you have increased abdominal pain, increased bleeding or passing out. [facility] Monday, May 1 to have blood drawn.  Abdominal Pain During Pregnancy Belly (abdominal) pain is common during pregnancy. Most of the time, it is not a serious problem. Other times, it can be a sign that something is wrong with the pregnancy. Always tell your doctor if you have belly pain. HOME CARE Monitor your belly pain for any changes. The following actions may help you feel better:  Do not have sex (intercourse) or put anything in your vagina until you feel better.  Rest until your pain stops.  Drink clear fluids if you feel sick to your stomach (nauseous). Do not eat solid food until you feel better.  Only take medicine as told by your doctor.  Keep all doctor visits as told. GET HELP RIGHT AWAY IF:   You are bleeding, leaking fluid, or pieces of tissue come out of your vagina.  You have more pain or cramping.  You keep throwing up (vomiting).  You have pain when you pee (urinate) or have blood in your pee.  You have a fever.  You do not feel your baby moving as much.  You feel very weak or feel like passing out.  You have trouble breathing, with or without belly pain.  You have a very bad headache and belly pain.  You have fluid leaking from your vagina and belly pain.  You keep having watery poop (diarrhea).  Your belly pain does not go away after resting, or the pain gets worse. MAKE SURE YOU:   Understand these instructions.  Will watch your condition.  Will get help right away if you are not doing well or get worse.   This information is not intended to replace advice given to you by your health care provider. Make sure you discuss any questions you have with your health care provider.   Document Released: 03/26/2009 Document Revised: 12/08/2012 Document Reviewed: 11/04/2012 Elsevier Interactive Patient Education [employer]2016 Elsevier  [employer]

## 2015-08-16 NOTE — ED Notes (Signed)
EDP at bedside  

## 2015-08-16 NOTE — ED Notes (Signed)
Pt back from US

## 2015-08-17 ENCOUNTER — Ambulatory Visit (HOSPITAL_COMMUNITY): Admission: RE | Admit: 2015-08-17 | Payer: BLUE CROSS/BLUE SHIELD | Source: Ambulatory Visit

## 2015-08-17 ENCOUNTER — Other Ambulatory Visit: Payer: BLUE CROSS/BLUE SHIELD

## 2015-08-19 ENCOUNTER — Inpatient Hospital Stay (HOSPITAL_COMMUNITY)
Admission: AD | Admit: 2015-08-19 | Discharge: 2015-08-20 | Disposition: A | Discharge status: Home / Self Care | Payer: BLUE CROSS/BLUE SHIELD | Source: Ambulatory Visit | Attending: Obstetrics and Gynecology | Admitting: Obstetrics and Gynecology

## 2015-08-19 ENCOUNTER — Encounter (HOSPITAL_COMMUNITY): Payer: Self-pay | Admitting: *Deleted

## 2015-08-19 DIAGNOSIS — Z3A08 8 weeks gestation of pregnancy: Secondary | ICD-10-CM | POA: Insufficient documentation

## 2015-08-19 DIAGNOSIS — O4691 Antepartum hemorrhage, unspecified, first trimester: Secondary | ICD-10-CM | POA: Diagnosis not present

## 2015-08-19 DIAGNOSIS — O209 Hemorrhage in early pregnancy, unspecified: Secondary | ICD-10-CM | POA: Diagnosis not present

## 2015-08-19 DIAGNOSIS — R109 Unspecified abdominal pain: Secondary | ICD-10-CM | POA: Diagnosis not present

## 2015-08-19 DIAGNOSIS — O9989 Other specified diseases and conditions complicating pregnancy, childbirth and the puerperium: Secondary | ICD-10-CM | POA: Diagnosis not present

## 2015-08-19 DIAGNOSIS — O3680X0 Pregnancy with inconclusive fetal viability, not applicable or unspecified: Secondary | ICD-10-CM

## 2015-08-19 DIAGNOSIS — Z3A09 9 weeks gestation of pregnancy: Secondary | ICD-10-CM | POA: Diagnosis not present

## 2015-08-19 LAB — CBC
HEMATOCRIT: 32.1 % — AB (ref 36.0–46.0)
Hemoglobin: 9.8 g/dL — ABNORMAL LOW (ref 12.0–15.0)
MCH: 21.3 pg — AB (ref 26.0–34.0)
MCHC: 30.5 g/dL (ref 30.0–36.0)
MCV: 69.6 fL — AB (ref 78.0–100.0)
PLATELETS: 343 10*3/uL (ref 150–400)
RBC: 4.61 MIL/uL (ref 3.87–5.11)
RDW: 17.1 % — ABNORMAL HIGH (ref 11.5–15.5)
WBC: 10.9 10*3/uL — AB (ref 4.0–10.5)

## 2015-08-19 LAB — COMPREHENSIVE METABOLIC PANEL
ALK PHOS: 59 U/L (ref 38–126)
ALT: 28 U/L (ref 14–54)
ANION GAP: 5 (ref 5–15)
AST: 19 U/L (ref 15–41)
Albumin: 3.3 g/dL — ABNORMAL LOW (ref 3.5–5.0)
BILIRUBIN TOTAL: 0.5 mg/dL (ref 0.3–1.2)
BUN: 15 mg/dL (ref 6–20)
CALCIUM: 9.1 mg/dL (ref 8.9–10.3)
CO2: 28 mmol/L (ref 22–32)
CREATININE: 0.73 mg/dL (ref 0.44–1.00)
Chloride: 106 mmol/L (ref 101–111)
GFR calc non Af Amer: >60 mL/min (ref >60)
Glucose, Bld: 95 mg/dL (ref 65–99)
Potassium: 4.2 mmol/L (ref 3.5–5.1)
SODIUM: 139 mmol/L (ref 135–145)
TOTAL PROTEIN: 6.9 g/dL (ref 6.5–8.1)

## 2015-08-19 LAB — URINALYSIS, ROUTINE W REFLEX MICROSCOPIC
BILIRUBIN URINE: NEGATIVE
Glucose, UA: NEGATIVE mg/dL
Ketones, ur: NEGATIVE mg/dL
Leukocytes, UA: NEGATIVE
NITRITE: NEGATIVE
PH: 6 (ref 5.0–8.0)
Protein, ur: NEGATIVE mg/dL
SPECIFIC GRAVITY, URINE: 1.02 (ref 1.005–1.030)

## 2015-08-19 LAB — URINE MICROSCOPIC-ADD ON

## 2015-08-19 LAB — HCG, QUANTITATIVE, PREGNANCY: HCG, BETA CHAIN, QUANT, S: 816 m[IU]/mL — AB (ref <5)

## 2015-08-19 NOTE — MAU Provider Note (Signed)
History     CSN: [id number]  Arrival date and time: 08/19/15 2013   First Provider Initiated Contact with Patient 08/19/15 2043      Chief Complaint  Patient presents with  . Vaginal Bleeding   Vaginal Bleeding The patient's primary symptoms include vaginal bleeding. This is a new problem. The current episode started today (was seen on 4/27, and has not had any bleeding since then until today. ). The problem occurs intermittently. The patient is experiencing no pain. She is pregnant. Associated symptoms include constipation and nausea. Pertinent negatives include no abdominal pain, chills, diarrhea, dysuria, fever, frequency, urgency or vomiting. The vaginal discharge was bloody. The vaginal bleeding is spotting (only when wiping. Has not needed to use a pad. ). She has been passing clots (one clot about the size of a dime. ). She has not been passing tissue. Nothing aggravates the symptoms. She has tried nothing for the symptoms. Menstrual history: LMP 06/20/15    Past Medical History  Diagnosis Date  . Medical history non-contributory   . Obesity     Past Surgical History  Procedure Laterality Date  . Tonsillectomy    . Wisdom tooth extraction      Family History  Problem Relation Age of Onset  . Diabetes Father   . Diabetes Maternal Grandfather   . Cancer Paternal Grandmother     Social History  Substance Use Topics  . Smoking status: Never Smoker   . Smokeless tobacco: Never Used  . Alcohol Use: No    Allergies: No Known Allergies  Prescriptions prior to admission  Medication Sig Dispense Refill Last Dose  . ferrous sulfate 325 (65 FE) MG tablet Take 1 tablet (325 mg total) by mouth 2 (two) times daily with a meal. 30 tablet 0 08/15/2015 at Unknown time  . Prenatal Vit-Fe Fumarate-FA (PRENATAL MULTIVITAMIN) TABS tablet Take 1 tablet by mouth daily at 12 noon.   08/15/2015 at Unknown time    Review of Systems  Constitutional: Negative for fever and chills.   Gastrointestinal: Positive for nausea and constipation. Negative for vomiting, abdominal pain and diarrhea.  Genitourinary: Positive for vaginal bleeding. Negative for dysuria, urgency and frequency.   Physical Exam   Last menstrual period 06/20/2015.  Physical Exam  Nursing note and vitals reviewed. Constitutional: She is oriented to person, place, and time. She appears well-developed and well-nourished. No distress.  HENT:  Head: Normocephalic.  Cardiovascular: Normal rate.   Respiratory: Effort normal.  GI: Soft. There is no tenderness. There is no rebound.  Neurological: She is alert and oriented to person, place, and time.  Skin: Skin is warm and dry.  Psychiatric: She has a normal mood and affect.   Results for Rubin PayorAYLOR, Danielys (MRN [id number]) as of 08/19/2015 21:44  Ref. Range 08/08/2015 00:03 08/08/2015 00:39 08/10/2015 08:30 08/13/2015 14:00 08/15/2015 11:25 08/15/2015 20:53 08/15/2015 20:55 08/15/2015 20:57 08/15/2015 20:58 08/16/2015 01:21 08/19/2015 20:20 08/19/2015 20:31  HCG, Beta Chain, Quant, S Latest Ref Range: <5 mIU/mL 387 (H)  535 (H) 565.9 (H) 679 (H)  748 (H)     816 (H)   Results for orders placed or performed during the hospital encounter of 08/19/15 (from the past 24 hour(s))  Urinalysis, Routine w reflex microscopic (not [facility])     Status: Abnormal   Collection Time: 08/19/15  8:20 PM  Result Value Ref Range   Color, Urine YELLOW YELLOW   APPearance CLEAR CLEAR   Specific Gravity, Urine 1.020 1.005 - 1.030   pH  6.0 5.0 - 8.0   Glucose, UA NEGATIVE NEGATIVE mg/dL   Hgb urine dipstick LARGE (A) NEGATIVE   Bilirubin Urine NEGATIVE NEGATIVE   Ketones, ur NEGATIVE NEGATIVE mg/dL   Protein, ur NEGATIVE NEGATIVE mg/dL   Nitrite NEGATIVE NEGATIVE   Leukocytes, UA NEGATIVE NEGATIVE  Urine microscopic-add on     Status: Abnormal   Collection Time: 08/19/15  8:20 PM  Result Value Ref Range   Squamous Epithelial / LPF 0-5 (A) NONE SEEN   WBC, UA 0-5 0 - 5 WBC/hpf   RBC  / HPF 6-30 0 - 5 RBC/hpf   Bacteria, UA FEW (A) NONE SEEN  CBC     Status: Abnormal   Collection Time: 08/19/15  8:31 PM  Result Value Ref Range   WBC 10.9 (H) 4.0 - 10.5 K/uL   RBC 4.61 3.87 - 5.11 MIL/uL   Hemoglobin 9.8 (L) 12.0 - 15.0 g/dL   HCT 16.1 (L) 09.6 - 04.5 %   MCV 69.6 (L) 78.0 - 100.0 fL   MCH 21.3 (L) 26.0 - 34.0 pg   MCHC 30.5 30.0 - 36.0 g/dL   RDW 40.9 (H) 81.1 - 91.4 %   Platelets 343 150 - 400 K/uL  hCG, quantitative, pregnancy     Status: Abnormal   Collection Time: 08/19/15  8:31 PM  Result Value Ref Range   hCG, Beta Chain, Quant, S 816 (H) <5 mIU/mL  Comprehensive metabolic panel     Status: Abnormal   Collection Time: 08/19/15  8:31 PM  Result Value Ref Range   Sodium 139 135 - 145 mmol/L   Potassium 4.2 3.5 - 5.1 mmol/L   Chloride 106 101 - 111 mmol/L   CO2 28 22 - 32 mmol/L   Glucose, Bld 95 65 - 99 mg/dL   BUN 15 6 - 20 mg/dL   Creatinine, Ser 7.82 0.44 - 1.00 mg/dL   Calcium 9.1 8.9 - 95.6 mg/dL   Total Protein 6.9 6.5 - 8.1 g/dL   Albumin 3.3 (L) 3.5 - 5.0 g/dL   AST 19 15 - 41 U/L   ALT 28 14 - 54 U/L   Alkaline Phosphatase 59 38 - 126 U/L   Total Bilirubin 0.5 0.3 - 1.2 mg/dL   GFR calc non Af Amer >60 >60 mL/min   GFR calc Af Amer >60 >60 mL/min   Anion gap 5 5 - 15   US Ob Transvaginal  08/20/2015  CLINICAL DATA:  Bleeding since 3 p.m. Intermittent spotting. Estimated gestational age by LMP is 8 weeks 5 days. Quantitative beta HCG is 816 and is rising. EXAM: TRANSVAGINAL OB ULTRASOUND TECHNIQUE: Transvaginal ultrasound was performed for complete evaluation of the gestation as well as the maternal uterus, adnexal regions, and pelvic cul-de-sac. COMPARISON:  08/16/2015 FINDINGS: Intrauterine gestational sac: No intrauterine gestational sac identified. Yolk sac:  Not identified. Embryo:  Not identified. Cardiac Activity: Not identified. Maternal uterus/adnexae: Homogeneous appearance of the myometrium. Endometrial stripe is not significantly  thickened. There is small amount of fluid within the endometrium and more prominent fluid in the endocervical canal. No myometrial mass lesions. Ovaries are visualized with limited visualization. No abnormal adnexal masses are seen. No free fluid identified in the pelvis. IMPRESSION: No intrauterine pregnancy identified. Given history of rising quantitative beta HCG, this indicates pregnancy of unknown location. No intrauterine gestational sac, yolk sac, or fetal pole identified. Differential considerations include intrauterine pregnancy too early to be sonographically visualized, missed abortion, or ectopic pregnancy. Followup ultrasound is recommended  in 10-14 days for further evaluation. Electronically Signed   By: Burman Nieves M.D.   On: 08/20/2015 00:32    MAU Course  Procedures  MDM 2315: D/W Dr. Emelda Fear. He will come see the patient.  DW the patient that we will repeat US today, but that numbers show that this pregnancy is not viable. Recommend MTX today as likelihood of ectopic pregnancy is high with slowly rising HCG. Patient is refusing. She states that she has an appointment at [facility] on Tuesday, and she wants to follow up there. Patient signed out AMA.  Assessment and Plan   1. Pregnancy, location unknown    Patient left AMA Papers signed Ectopic precautions reviewed   Tawnya Crook 08/19/2015, 8:48 PM

## 2015-08-20 ENCOUNTER — Inpatient Hospital Stay (HOSPITAL_COMMUNITY): Payer: BLUE CROSS/BLUE SHIELD

## 2015-08-20 DIAGNOSIS — R109 Unspecified abdominal pain: Secondary | ICD-10-CM | POA: Diagnosis not present

## 2015-08-20 DIAGNOSIS — Z3A09 9 weeks gestation of pregnancy: Secondary | ICD-10-CM

## 2015-08-20 DIAGNOSIS — O4691 Antepartum hemorrhage, unspecified, first trimester: Secondary | ICD-10-CM

## 2015-08-20 DIAGNOSIS — O9989 Other specified diseases and conditions complicating pregnancy, childbirth and the puerperium: Secondary | ICD-10-CM | POA: Diagnosis not present

## 2015-08-20 NOTE — MAU Note (Signed)
Informed pt of ultrasound results and that Dr. Emelda FearFerguson would be in to discuss next step of care .  She decided to go home and follow up in office at her scheduled appt on May 2.

## 2015-08-21 ENCOUNTER — Ambulatory Visit (INDEPENDENT_AMBULATORY_CARE_PROVIDER_SITE_OTHER): Payer: BLUE CROSS/BLUE SHIELD | Admitting: Obstetrics

## 2015-08-21 ENCOUNTER — Encounter: Payer: Self-pay | Admitting: Certified Nurse Midwife

## 2015-08-21 VITALS — BP 141/88 | HR 100 | Wt 265.0 lb

## 2015-08-21 DIAGNOSIS — O200 Threatened abortion: Secondary | ICD-10-CM | POA: Diagnosis not present

## 2015-08-21 DIAGNOSIS — Z1389 Encounter for screening for other disorder: Secondary | ICD-10-CM | POA: Diagnosis not present

## 2015-08-21 DIAGNOSIS — Z331 Pregnant state, incidental: Secondary | ICD-10-CM

## 2015-08-21 DIAGNOSIS — O99011 Anemia complicating pregnancy, first trimester: Secondary | ICD-10-CM

## 2015-08-21 DIAGNOSIS — O209 Hemorrhage in early pregnancy, unspecified: Secondary | ICD-10-CM

## 2015-08-21 DIAGNOSIS — O3680X0 Pregnancy with inconclusive fetal viability, not applicable or unspecified: Secondary | ICD-10-CM

## 2015-08-21 LAB — POCT URINALYSIS DIPSTICK
BILIRUBIN UA: NEGATIVE
GLUCOSE UA: NEGATIVE
Ketones, UA: NEGATIVE
LEUKOCYTES UA: NEGATIVE
NITRITE UA: NEGATIVE
Protein, UA: NEGATIVE
RBC UA: 250
Spec Grav, UA: 1.02
UROBILINOGEN UA: NEGATIVE
pH, UA: 5

## 2015-08-21 MED ORDER — FUSION PLUS PO CAPS: 1.0000 | ORAL_CAPSULE | Freq: Every day | ORAL | Status: DC

## 2015-08-21 NOTE — Progress Notes (Signed)
S: Patient here for new OB visit as a new patient.  [facility] is [facility] being [facility] and [facility] this pregnancy, and has      not been released from the current work up.  Patient has no complaints today.     O:  Afebrile  VSS        PE:  Deferred   A:  Early 1st trimester pregnancy.  R/O Ectopic.   P: Called triage and spoke with [place], the nurse practitioner on call today.  [facility] is still managing this patient and instructions given to have           patient come to triage for further management.  Patient agrees.   Charles A. Clearance CootsHarper MD 08-21-15

## 2015-08-21 NOTE — Progress Notes (Signed)
Patient has concern that she is bleeding. Started spotting on Saturday & got heavier & [facility] Bleeding has gotten worse since then. U/S done because #'s aren't doubling like they should. Didn't show a Ectopic pregnancy.

## 2015-08-22 ENCOUNTER — Other Ambulatory Visit: Payer: Self-pay | Admitting: Certified Nurse Midwife

## 2015-08-22 ENCOUNTER — Encounter (HOSPITAL_COMMUNITY): Payer: Self-pay | Admitting: Certified Nurse Midwife

## 2015-08-22 ENCOUNTER — Inpatient Hospital Stay (HOSPITAL_COMMUNITY)
Admission: AD | Admit: 2015-08-22 | Discharge: 2015-08-22 | Disposition: A | Discharge status: Home / Self Care | Payer: BLUE CROSS/BLUE SHIELD | Source: Ambulatory Visit | Attending: Obstetrics & Gynecology | Admitting: Obstetrics & Gynecology

## 2015-08-22 DIAGNOSIS — R109 Unspecified abdominal pain: Secondary | ICD-10-CM | POA: Diagnosis not present

## 2015-08-22 DIAGNOSIS — O4691 Antepartum hemorrhage, unspecified, first trimester: Secondary | ICD-10-CM | POA: Diagnosis not present

## 2015-08-22 DIAGNOSIS — Z3A00 Weeks of gestation of pregnancy not specified: Secondary | ICD-10-CM | POA: Insufficient documentation

## 2015-08-22 DIAGNOSIS — O209 Hemorrhage in early pregnancy, unspecified: Secondary | ICD-10-CM | POA: Diagnosis not present

## 2015-08-22 DIAGNOSIS — O3680X0 Pregnancy with inconclusive fetal viability, not applicable or unspecified: Secondary | ICD-10-CM

## 2015-08-22 LAB — CBC
HEMATOCRIT: 31.8 % — AB (ref 36.0–46.0)
Hemoglobin: 9.8 g/dL — ABNORMAL LOW (ref 12.0–15.0)
MCH: 21.4 pg — ABNORMAL LOW (ref 26.0–34.0)
MCHC: 30.8 g/dL (ref 30.0–36.0)
MCV: 69.3 fL — AB (ref 78.0–100.0)
PLATELETS: 341 10*3/uL (ref 150–400)
RBC: 4.59 MIL/uL (ref 3.87–5.11)
RDW: 17.1 % — AB (ref 11.5–15.5)
WBC: 10.8 10*3/uL — AB (ref 4.0–10.5)

## 2015-08-22 LAB — COMPREHENSIVE METABOLIC PANEL
ALBUMIN: 3.3 g/dL — AB (ref 3.5–5.0)
ALT: 26 U/L (ref 14–54)
ANION GAP: 6 (ref 5–15)
AST: 18 U/L (ref 15–41)
Alkaline Phosphatase: 62 U/L (ref 38–126)
BILIRUBIN TOTAL: 0.4 mg/dL (ref 0.3–1.2)
BUN: 15 mg/dL (ref 6–20)
CHLORIDE: 103 mmol/L (ref 101–111)
CO2: 27 mmol/L (ref 22–32)
CREATININE: 0.6 mg/dL (ref 0.44–1.00)
Calcium: 9.1 mg/dL (ref 8.9–10.3)
GFR calc Af Amer: >60 mL/min (ref >60)
GFR calc non Af Amer: >60 mL/min (ref >60)
Glucose, Bld: 80 mg/dL (ref 65–99)
POTASSIUM: 4.2 mmol/L (ref 3.5–5.1)
SODIUM: 136 mmol/L (ref 135–145)
Total Protein: 6.9 g/dL (ref 6.5–8.1)

## 2015-08-22 LAB — URINALYSIS, ROUTINE W REFLEX MICROSCOPIC
BILIRUBIN URINE: NEGATIVE
Glucose, UA: NEGATIVE mg/dL
KETONES UR: NEGATIVE mg/dL
LEUKOCYTES UA: NEGATIVE
NITRITE: NEGATIVE
PH: 6.5 (ref 5.0–8.0)
Protein, ur: 30 mg/dL — AB
SPECIFIC GRAVITY, URINE: 1.015 (ref 1.005–1.030)

## 2015-08-22 LAB — HCG, QUANTITATIVE, PREGNANCY: hCG, Beta Chain, Quant, S: 816 m[IU]/mL — ABNORMAL HIGH (ref <5)

## 2015-08-22 LAB — URINE MICROSCOPIC-ADD ON: WBC UA: NONE SEEN WBC/hpf (ref 0–5)

## 2015-08-22 LAB — BETA HCG QUANT (REF LAB): HCG QUANT: 730 m[IU]/mL

## 2015-08-22 NOTE — MAU Note (Signed)
Pt c/o vaginal bleeding and sharp LLQ pain. Pt states that she has changed her pad 2 times today. Rates pain 8/10. Did not take anything for pain.

## 2015-08-22 NOTE — Discharge Instructions (Signed)

## 2015-08-22 NOTE — MAU Provider Note (Signed)
History     CSN: [id number]  Arrival date and time: 08/22/15 2012   First Provider Initiated Contact with Patient 08/22/15 2132      Chief Complaint  Patient presents with  . Vaginal Bleeding  . Abdominal Cramping   Vaginal Bleeding The patient's primary symptoms include pelvic pain and vaginal bleeding. This is a new problem. The current episode started yesterday. The problem occurs intermittently. The problem has been unchanged. Pain severity now: No pain currently, had some LLQ cramping about an hour ago.  The problem affects the left side. She is pregnant. Associated symptoms include abdominal pain. Pertinent negatives include no chills, constipation, diarrhea, dysuria, fever, frequency, nausea, urgency or vomiting. The vaginal discharge was normal. The vaginal bleeding is lighter than menses (patient states that she has used 2 pads today. ). She has not been passing clots. She has not been passing tissue. Menstrual history: LMP 06/20/15     Past Medical History  Diagnosis Date  . Medical history non-contributory   . Obesity     Past Surgical History  Procedure Laterality Date  . Tonsillectomy    . Wisdom tooth extraction      Family History  Problem Relation Age of Onset  . Diabetes Father   . Diabetes Maternal Grandfather   . Cancer Paternal Grandmother     Social History  Substance Use Topics  . Smoking status: Never Smoker   . Smokeless tobacco: Never Used  . Alcohol Use: No    Allergies: No Known Allergies  Prescriptions prior to admission  Medication Sig Dispense Refill Last Dose  . ferrous sulfate 325 (65 FE) MG tablet Take 1 tablet (325 mg total) by mouth 2 (two) times daily with a meal. 30 tablet 0 Taking  . Iron-FA-B Cmp-C-Biot-Probiotic (FUSION PLUS) CAPS Take 1 tablet by mouth daily. 30 capsule 5   . Prenatal Vit-Fe Fumarate-FA (PRENATAL MULTIVITAMIN) TABS tablet Take 1 tablet by mouth daily at 12 noon.   Taking    Review of Systems  Constitutional:  Negative for fever and chills.  Gastrointestinal: Positive for abdominal pain. Negative for nausea, vomiting, diarrhea and constipation.  Genitourinary: Positive for vaginal bleeding and pelvic pain. Negative for dysuria, urgency and frequency.   Physical Exam   Blood pressure 120/76, pulse 97, temperature 99.2 F (37.3 C), temperature source Oral, resp. rate 18, height 5\' 3"  (1.6 m), weight 122.018 kg (269 lb), last menstrual period 06/20/2015, SpO2 100 %.  Physical Exam  Nursing note and vitals reviewed. Constitutional: She is oriented to person, place, and time. She appears well-developed and well-nourished. No distress.  HENT:  Head: Normocephalic.  Cardiovascular: Normal rate.   Respiratory: Effort normal.  GI: Soft. There is no tenderness. There is no rebound.  Genitourinary:   External: no lesion Vagina: small amount of blood seen  Cervix: pink, smooth, no CMT Uterus: NSSC    Neurological: She is alert and oriented to person, place, and time.  Skin: Skin is warm and dry.  Psychiatric: She has a normal mood and affect.  Results for Rubin PayorAYLOR, Cydne (MRN [id number]) as of 08/22/2015 22:17  Ref. Range 07/30/2015 21:55 08/08/2015 00:03 08/10/2015 08:30 08/13/2015 14:00 08/15/2015 11:25 08/15/2015 20:53 08/15/2015 20:55 08/19/2015 20:31 08/21/2015 10:22 08/22/2015 20:27 08/22/2015 20:28  HCG, Beta Chain, Quant, S Latest Ref Range: <5 mIU/mL 42 (H) 387 (H) 535 (H) 565.9 (H) 679 (H)  748 (H) 816 (H)   816 (H)    MAU Course  Procedures  MDM D/W the patient at  length. Concern for ectopic pregnancy is high due to HCG levels not increasing normally. Advised MTX again today for the patient. Patient is refusing today. Offered 48 hour follow up. Patient states that she would like to discuss with partner. After discussion with partner she states that she would like 48 hour follow up. Advised patient to come to to MAU Friday evening. Patient states that she cannot come on Friday as she has an appointment  back home with her doctor in [place]. Ectopic precautions reviewed at length with the patient. Will give copy of most recent US report to share with doctor. Patient knows to return with any worsening sx. D/W Dr. Despina Hidden, he agrees with plan of care.   Assessment and Plan   1. Pregnancy, location unknown   2. Vaginal bleeding in pregnancy, first trimester    DC home Bleeding precautions Ectopic precautions RX: none  Return to MAU as needed   Follow-up Information    Follow up with [facility].   Why:  If symptoms worsen   Contact information:   [address] [id number] mc [place] [address] [phone]        Tawnya Crook 08/22/2015, 9:34 PM

## 2015-08-24 ENCOUNTER — Encounter: Payer: Self-pay | Admitting: Obstetrics

## 2015-08-29 ENCOUNTER — Encounter (HOSPITAL_COMMUNITY): Payer: Self-pay

## 2015-08-29 ENCOUNTER — Inpatient Hospital Stay (HOSPITAL_COMMUNITY): Payer: BLUE CROSS/BLUE SHIELD

## 2015-08-29 ENCOUNTER — Inpatient Hospital Stay (HOSPITAL_COMMUNITY)
Admission: AD | Admit: 2015-08-29 | Discharge: 2015-08-29 | Disposition: A | Discharge status: Home / Self Care | Payer: BLUE CROSS/BLUE SHIELD | Source: Ambulatory Visit | Attending: Obstetrics & Gynecology | Admitting: Obstetrics & Gynecology

## 2015-08-29 DIAGNOSIS — J111 Influenza due to unidentified influenza virus with other respiratory manifestations: Secondary | ICD-10-CM

## 2015-08-29 DIAGNOSIS — R05 Cough: Secondary | ICD-10-CM | POA: Diagnosis present

## 2015-08-29 DIAGNOSIS — R079 Chest pain, unspecified: Secondary | ICD-10-CM | POA: Insufficient documentation

## 2015-08-29 DIAGNOSIS — Z3A10 10 weeks gestation of pregnancy: Secondary | ICD-10-CM | POA: Diagnosis not present

## 2015-08-29 DIAGNOSIS — O3680X0 Pregnancy with inconclusive fetal viability, not applicable or unspecified: Secondary | ICD-10-CM

## 2015-08-29 DIAGNOSIS — O9989 Other specified diseases and conditions complicating pregnancy, childbirth and the puerperium: Secondary | ICD-10-CM

## 2015-08-29 DIAGNOSIS — O26891 Other specified pregnancy related conditions, first trimester: Secondary | ICD-10-CM | POA: Diagnosis not present

## 2015-08-29 LAB — CBC WITH DIFFERENTIAL/PLATELET
BASOS PCT: 0 %
Basophils Absolute: 0 10*3/uL (ref 0.0–0.1)
Eosinophils Absolute: 0.2 10*3/uL (ref 0.0–0.7)
Eosinophils Relative: 3 %
HEMATOCRIT: 31.5 % — AB (ref 36.0–46.0)
Hemoglobin: 9.7 g/dL — ABNORMAL LOW (ref 12.0–15.0)
LYMPHS ABS: 2.4 10*3/uL (ref 0.7–4.0)
Lymphocytes Relative: 49 %
MCH: 20.9 pg — AB (ref 26.0–34.0)
MCHC: 30.8 g/dL (ref 30.0–36.0)
MCV: 67.9 fL — ABNORMAL LOW (ref 78.0–100.0)
MONO ABS: 0.4 10*3/uL (ref 0.1–1.0)
MONOS PCT: 8 %
NEUTROS ABS: 1.9 10*3/uL (ref 1.7–7.7)
Neutrophils Relative %: 39 %
Platelets: 287 10*3/uL (ref 150–400)
RBC: 4.64 MIL/uL (ref 3.87–5.11)
RDW: 16.9 % — AB (ref 11.5–15.5)
WBC: 4.9 10*3/uL (ref 4.0–10.5)

## 2015-08-29 LAB — URINALYSIS, ROUTINE W REFLEX MICROSCOPIC
Bilirubin Urine: NEGATIVE
GLUCOSE, UA: NEGATIVE mg/dL
KETONES UR: NEGATIVE mg/dL
Leukocytes, UA: NEGATIVE
Nitrite: NEGATIVE
PH: 6 (ref 5.0–8.0)
PROTEIN: NEGATIVE mg/dL
Specific Gravity, Urine: 1.025 (ref 1.005–1.030)

## 2015-08-29 LAB — URINE MICROSCOPIC-ADD ON: WBC UA: NONE SEEN WBC/hpf (ref 0–5)

## 2015-08-29 LAB — INFLUENZA PANEL BY PCR (TYPE A & B)
H1N1FLUPCR: NOT DETECTED
INFLBPCR: NEGATIVE
Influenza A By PCR: NEGATIVE

## 2015-08-29 LAB — HCG, QUANTITATIVE, PREGNANCY: hCG, Beta Chain, Quant, S: 503 m[IU]/mL — ABNORMAL HIGH (ref <5)

## 2015-08-29 MED ORDER — ALBUTEROL SULFATE HFA 108 (90 BASE) MCG/ACT IN AERS: 2.0000 | INHALATION_SPRAY | Freq: Four times a day (QID) | RESPIRATORY_TRACT | Status: AC | PRN

## 2015-08-29 MED ORDER — OSELTAMIVIR PHOSPHATE 75 MG PO CAPS: 75.0000 mg | ORAL_CAPSULE | Freq: Two times a day (BID) | ORAL | Status: DC

## 2015-08-29 NOTE — Discharge Instructions (Signed)

## 2015-08-29 NOTE — MAU Note (Signed)
Pt reports a sore throat, cough, and feeling like she can't take a deep breath for the last 2 days. Also reports pain in her upper abd and chest that radiates to her mid back for the last 24 hours, states the pain is worse when she coughs. Chills and nausea also.

## 2015-08-29 NOTE — MAU Provider Note (Signed)
History     CSN: [id number]  Arrival date and time: 08/29/15 0214   First Provider Initiated Contact with Patient 08/29/15 0242      Chief Complaint  Patient presents with  . Cough  . Chest Pain  . Emesis   HPI [name] is a 20 y.o. G1P0000 at [redacted]w[redacted]d who presents to MAU today with complaint of cough, congestion, chest pain with coughing, N/V and upper abdominal pain that radiates to her back x 2 days. She denies diarrhea, fever or sick contacts. The patient states that she did follow-up with PCP on 08/24/15 as intended and labs were drawn. She does not know the results of the labs, so unsure if there was any change in hCG. The patient has been seen in MAU multiple times for evaluation of the pregnancy. Quant hCG has not risen appropriately and last US showed nothing in the uterus or adnexa. She denies lower abdominal pain or bleeding today.   OB History    Gravida Para Term Preterm AB TAB SAB Ectopic Multiple Living        Past Medical History  Diagnosis Date  . Medical history non-contributory   . Obesity     Past Surgical History  Procedure Laterality Date  . Tonsillectomy    . Wisdom tooth extraction      Family History  Problem Relation Age of Onset  . Diabetes Father   . Diabetes Maternal Grandfather   . Cancer Paternal Grandmother     Social History  Substance Use Topics  . Smoking status: Never Smoker   . Smokeless tobacco: Never Used  . Alcohol Use: No    Allergies: No Known Allergies  Prescriptions prior to admission  Medication Sig Dispense Refill Last Dose  . ferrous sulfate 325 (65 FE) MG tablet Take 1 tablet (325 mg total) by mouth 2 (two) times daily with a meal. 30 tablet 0 08/21/2015 at Unknown time  . Iron-FA-B Cmp-C-Biot-Probiotic (FUSION PLUS) CAPS Take 1 tablet by mouth daily. (Patient not taking: Reported on 08/22/2015) 30 capsule 5 Not Taking at Unknown time  . Prenatal Vit-Fe Fumarate-FA (PRENATAL MULTIVITAMIN)  TABS tablet Take 1 tablet by mouth daily at 12 noon.   08/22/2015 at Unknown time    Review of Systems  Constitutional: Positive for chills and malaise/fatigue. Negative for fever.  HENT: Positive for congestion and sore throat. Negative for ear pain.   Respiratory: Positive for cough and sputum production.   Cardiovascular: Positive for chest pain.  Gastrointestinal: Positive for nausea, vomiting and abdominal pain. Negative for diarrhea and constipation.  Genitourinary: Negative for dysuria, urgency and frequency.       Neg - vaginal bleeding  Neurological: Positive for headaches. Negative for weakness.   Physical Exam   Blood pressure 153/84, pulse 108, temperature 99.1 F (37.3 C), temperature source Oral, resp. rate 20, height  (1.6 m), weight 264 lb (119.75 kg), last menstrual period 06/20/2015, SpO2 98 %.  Physical Exam  Nursing note and vitals reviewed. Constitutional: She is oriented to person, place, and time. She appears well-developed and well-nourished. No distress.  HENT:  Head: Normocephalic and atraumatic.  Right Ear: Tympanic membrane, external ear and ear canal normal.  Left Ear: Tympanic membrane, external ear and ear canal normal.  Nose: Mucosal edema and rhinorrhea present. Right sinus exhibits no maxillary sinus tenderness and no frontal sinus tenderness. Left sinus exhibits no maxillary sinus tenderness and no frontal  sinus tenderness.  Mouth/Throat: Uvula is midline, oropharynx is clear and moist and mucous membranes are normal. No oropharyngeal exudate, posterior oropharyngeal edema or posterior oropharyngeal erythema.  Eyes: Conjunctivae and EOM are normal.  Neck: Normal range of motion. Neck supple.  Cardiovascular: Normal rate, regular rhythm and normal heart sounds.   Respiratory: Effort normal and breath sounds normal. No respiratory distress. She has no wheezes. She has no rales.  GI: Soft. She exhibits no distension and no mass. There is tenderness  (mild LUQ abdominal tenderness to palpation). There is no rebound and no guarding.  Lymphadenopathy:    She has no cervical adenopathy.  Neurological: She is alert and oriented to person, place, and time.  Skin: Skin is warm and dry. No erythema.  Psychiatric: She has a normal mood and affect.     Results for orders placed or performed during the hospital encounter of 08/29/15 (from the past 24 hour(s))  Urinalysis, Routine w reflex microscopic (not at [facility])     Status: Abnormal   Collection Time: 08/29/15  2:29 AM  Result Value Ref Range   Color, Urine YELLOW YELLOW   APPearance CLEAR CLEAR   Specific Gravity, Urine 1.025 1.005 - 1.030   pH 6.0 5.0 - 8.0   Glucose, UA NEGATIVE NEGATIVE mg/dL   Hgb urine dipstick MODERATE (A) NEGATIVE   Bilirubin Urine NEGATIVE NEGATIVE   Ketones, ur NEGATIVE NEGATIVE mg/dL   Protein, ur NEGATIVE NEGATIVE mg/dL   Nitrite NEGATIVE NEGATIVE   Leukocytes, UA NEGATIVE NEGATIVE  Urine microscopic-add on     Status: Abnormal   Collection Time: 08/29/15  2:29 AM  Result Value Ref Range   Squamous Epithelial / LPF 0-5 (A) NONE SEEN   WBC, UA NONE SEEN 0 - 5 WBC/hpf   RBC / HPF 0-5 0 - 5 RBC/hpf   Bacteria, UA RARE (A) NONE SEEN   Urine-Other MICROSCOPIC EXAM PERFORMED ON UNCONCENTRATED URINE   hCG, quantitative, pregnancy     Status: Abnormal   Collection Time: 08/29/15  2:50 AM  Result Value Ref Range   hCG, Beta Chain, Quant, S 503 (H) <5 mIU/mL  CBC with Differential/Platelet     Status: Abnormal   Collection Time: 08/29/15  2:50 AM  Result Value Ref Range   WBC 4.9 4.0 - 10.5 K/uL   RBC 4.64 3.87 - 5.11 MIL/uL   Hemoglobin 9.7 (L) 12.0 - 15.0 g/dL   HCT 16.1 (L) 09.6 - 04.5 %   MCV 67.9 (L) 78.0 - 100.0 fL   MCH 20.9 (L) 26.0 - 34.0 pg   MCHC 30.8 30.0 - 36.0 g/dL   RDW 40.9 (H) 81.1 - 91.4 %   Platelets 287 150 - 400 K/uL   Neutrophils Relative % 39 %   Neutro Abs 1.9 1.7 - 7.7 K/uL   Lymphocytes Relative 49 %   Lymphs Abs 2.4 0.7 -  4.0 K/uL   Monocytes Relative 8 %   Monocytes Absolute 0.4 0.1 - 1.0 K/uL   Eosinophils Relative 3 %   Eosinophils Absolute 0.2 0.0 - 0.7 K/uL   Basophils Relative 0 %   Basophils Absolute 0.0 0.0 - 0.1 K/uL   US Ob Transvaginal  08/29/2015  CLINICAL DATA:  Pregnancy of unknown location. EXAM: TRANSVAGINAL OB ULTRASOUND TECHNIQUE: Transvaginal ultrasound was performed for complete evaluation of the gestation as well as the maternal uterus, adnexal regions, and pelvic cul-de-sac. COMPARISON:  08/20/2015 FINDINGS: Intrauterine gestational sac: None Yolk sac:  None Embryo:  None Cardiac  Activity: None Heart Rate:  bpm MSD:   mm    w     d CRL:     mm    w  d                  US EDC: Subchorionic hemorrhage:  None visualized. Maternal uterus/adnexae: Normal ovaries. No abnormal pelvic fluid collections. IMPRESSION: No visible gestational sac. Normal ovaries. No abnormal pelvic fluid collections. Electronically Signed   By: Ellery Plunkaniel R Mitchell M.D.   On: 08/29/2015 04:07    MAU Course  Procedures None  MDM Reviewed results and US from recent visits.  Patient has had mulitple visits in MAU, quant hCGs are not rising appropriately. Have been very similar at last few visits. Last US still showed no evidence of IUP Patient offered MTX on 5/3 - declined and did not follow-up on 5/5 as recommended.   EKG, CBC, quant hCG, flu swab and US today EKG - sinus tachycardia  Discussed patient with Dr. Macon LargeAnyanwu, although hCG is declining, this is still not considered a significant drop. Patient should be offered MTX and if she declines again she will need follow-up in clinic in 48 hours to ensure hCG continues to decline.  Discussed plan with patient. She declines MTX again today. [facility] for repeat labs.  Patient is concerned about SOB. EKG normal. O2 > 98 %. Lungs clear to ascultation. Will provide albuterol inhaler for short term use for symptomatic relief while treating  possible flu/URI Assessment and Plan  A: Pregnancy of unknown location Possible influenza vs other viral URI  P: Discharge home Rx for Tamiflu and Albuterol given to patient  Ectopic precautions discussed Patient advised to follow-up with [facility] on Friday at 8:00 am for repeat labs Patient may return to MAU as needed or if her condition were to change or worsen   Marny LowensteinJulie N Derrion Tritz, PA-C  08/29/2015, 4:31 AM

## 2015-08-31 ENCOUNTER — Other Ambulatory Visit: Payer: BLUE CROSS/BLUE SHIELD

## 2015-09-03 ENCOUNTER — Telehealth: Payer: Self-pay

## 2015-09-03 NOTE — Telephone Encounter (Signed)
[facility]. Pt no showed appt and I have called and left a message for patient.

## 2015-09-11 ENCOUNTER — Other Ambulatory Visit: Payer: BLUE CROSS/BLUE SHIELD

## 2015-09-12 ENCOUNTER — Other Ambulatory Visit: Payer: BLUE CROSS/BLUE SHIELD | Admitting: General Practice

## 2015-09-12 DIAGNOSIS — O3680X0 Pregnancy with inconclusive fetal viability, not applicable or unspecified: Secondary | ICD-10-CM

## 2015-09-12 LAB — HCG, QUANTITATIVE, PREGNANCY: HCG, BETA CHAIN, QUANT, S: 49 m[IU]/mL — AB (ref <5)

## 2015-09-12 NOTE — Progress Notes (Signed)
Patient here for bhcg. Patient has missed previous stat bhcg appt. Reviewed patient's chart with Rochele PagesWalidah Karim who recommends proceeding with stat. Patient reports no pain but continued brown spotting. Reviewed results with Rochele PagesWalidah Karim who recommends f/u bhcg in one week. Informed patient of results & recommendations. Patient verbalized understanding & asked when she could start trying again. Told patient to wait until we get this next level back, but anytime after that. Patient verbalized understanding & had no questions

## 2015-09-19 ENCOUNTER — Other Ambulatory Visit: Payer: BLUE CROSS/BLUE SHIELD

## 2015-10-12 ENCOUNTER — Other Ambulatory Visit: Payer: BLUE CROSS/BLUE SHIELD

## 2015-10-15 ENCOUNTER — Other Ambulatory Visit: Payer: BLUE CROSS/BLUE SHIELD

## 2016-03-20 ENCOUNTER — Inpatient Hospital Stay (HOSPITAL_COMMUNITY)
Admission: AD | Admit: 2016-03-20 | Discharge: 2016-03-20 | Disposition: A | Discharge status: Home / Self Care | Payer: Medicaid Other | Source: Ambulatory Visit | Attending: Obstetrics and Gynecology | Admitting: Obstetrics and Gynecology

## 2016-03-20 ENCOUNTER — Encounter (HOSPITAL_COMMUNITY): Payer: Self-pay | Admitting: *Deleted

## 2016-03-20 DIAGNOSIS — N926 Irregular menstruation, unspecified: Secondary | ICD-10-CM | POA: Diagnosis not present

## 2016-03-20 DIAGNOSIS — N912 Amenorrhea, unspecified: Secondary | ICD-10-CM | POA: Insufficient documentation

## 2016-03-20 DIAGNOSIS — Z3202 Encounter for pregnancy test, result negative: Secondary | ICD-10-CM | POA: Diagnosis not present

## 2016-03-20 LAB — URINALYSIS, ROUTINE W REFLEX MICROSCOPIC
GLUCOSE, UA: NEGATIVE mg/dL
KETONES UR: 15 mg/dL — AB
LEUKOCYTES UA: NEGATIVE
NITRITE: NEGATIVE
PH: 6 (ref 5.0–8.0)
PROTEIN: NEGATIVE mg/dL
Specific Gravity, Urine: 1.025 (ref 1.005–1.030)

## 2016-03-20 LAB — URINE MICROSCOPIC-ADD ON

## 2016-03-20 LAB — POCT PREGNANCY, URINE: PREG TEST UR: NEGATIVE

## 2016-03-20 NOTE — MAU Provider Note (Signed)
History     CSN: [id number]  Arrival date and time: 03/20/16 [id number]   First Provider Initiated Contact with Patient 03/20/16 2039      Chief Complaint  Patient presents with  . Pelvic Pain  . Amenorrhea   Julie Malone is a 20 y.o. G1P0000 who presents today with missed period. She states that she has had cramping off and on, and she keeps thinking that her period is about to start. However, it has not. She is here today to figure out why she is not having a period.    Pelvic Pain  The patient's primary symptoms include pelvic pain. This is a new problem. The current episode started yesterday. The problem occurs intermittently. The problem has been unchanged. The patient is experiencing no pain (No pain at this time, but "later on in the night I usually cramp bad"). She is not pregnant. Associated symptoms include abdominal pain and nausea ("off an on"). Pertinent negatives include no chills, constipation, diarrhea, dysuria, fever, frequency, urgency or vomiting. The vaginal discharge was normal. There has been no bleeding. Nothing aggravates the symptoms. Treatments tried: Midol. The treatment provided no relief. She is sexually active. No, her partner does not have an STD. She uses nothing for contraception. Her menstrual history has been irregular (LMP 01/13/16. Prior to that she had a SAB in May. She had a period in July. No Period in August, period in HydroSeptmeber, and none since. ).     Past Medical History:  Diagnosis Date  . Medical history non-contributory   . Obesity     Past Surgical History:  Procedure Laterality Date  . TONSILLECTOMY    . WISDOM TOOTH EXTRACTION      Family History  Problem Relation Age of Onset  . Diabetes Father   . Diabetes Maternal Grandfather   . Cancer Paternal Grandmother     Social History  Substance Use Topics  . Smoking status: Never Smoker  . Smokeless tobacco: Never Used  . Alcohol use No    Allergies: No Known  Allergies  Prescriptions Prior to Admission  Medication Sig Dispense Refill Last Dose  . albuterol (PROVENTIL HFA;VENTOLIN HFA) 108 (90 Base) MCG/ACT inhaler Inhale 2 puffs into the lungs every 6 (six) hours as needed for wheezing or shortness of breath. 1 Inhaler 0 Past Week at Unknown time    Review of Systems  Constitutional: Negative for chills and fever.  Gastrointestinal: Positive for abdominal pain and nausea ("off an on"). Negative for constipation, diarrhea and vomiting.  Genitourinary: Positive for pelvic pain. Negative for dysuria, frequency and urgency.   Physical Exam   Blood pressure 146/92, pulse 96, temperature 98.3 F (36.8 C), temperature source Oral, resp. rate 17, height 5\' 2"  (1.575 m), weight 265 lb (120.2 kg), last menstrual period 01/13/2016, SpO2 98 %, unknown if currently breastfeeding.  Physical Exam  Nursing note and vitals reviewed. Constitutional: She is oriented to person, place, and time. She appears well-developed and well-nourished. No distress.  HENT:  Head: Normocephalic.  Cardiovascular: Normal rate.   Respiratory: Effort normal.  GI: Soft. There is no tenderness. There is no rebound.  Neurological: She is alert and oriented to person, place, and time.  Skin: Skin is warm and dry.  Psychiatric: She has a normal mood and affect.   Results for orders placed or performed during the hospital encounter of 03/20/16 (from the past 24 hour(s))  Urinalysis, Routine w reflex microscopic (not [facility])     Status: Abnormal  Collection Time: 03/20/16  7:24 PM  Result Value Ref Range   Color, Urine YELLOW YELLOW   APPearance CLEAR CLEAR   Specific Gravity, Urine 1.025 1.005 - 1.030   pH 6.0 5.0 - 8.0   Glucose, UA NEGATIVE NEGATIVE mg/dL   Hgb urine dipstick TRACE (A) NEGATIVE   Bilirubin Urine SMALL (A) NEGATIVE   Ketones, ur 15 (A) NEGATIVE mg/dL   Protein, ur NEGATIVE NEGATIVE mg/dL   Nitrite NEGATIVE NEGATIVE   Leukocytes, UA NEGATIVE NEGATIVE   Urine microscopic-add on     Status: Abnormal   Collection Time: 03/20/16  7:24 PM  Result Value Ref Range   Squamous Epithelial / LPF 0-5 (A) NONE SEEN   WBC, UA 0-5 0 - 5 WBC/hpf   RBC / HPF 0-5 0 - 5 RBC/hpf   Bacteria, UA MANY (A) NONE SEEN   Urine-Other STARCH CRYSTALS PRESENT   Pregnancy, urine POC     Status: None   Collection Time: 03/20/16  7:37 PM  Result Value Ref Range   Preg Test, Ur NEGATIVE NEGATIVE    MAU Course  Procedures  MDM D/W the patient that we cannot do a full evaluation for amenorrhea in the emergency department. I can give her some phone numbers of places she can go to have this evaluated. She is happy with this plan.   Assessment and Plan   1. Amenorrhea    DC home Comfort measures reviewed  RX: none Return to MAU as needed FU with OB as planned Follow-up Information    [facility].   Contact information: [address] [place] [id number] [phone]        [facility] Follow up.   Contact information: [address] [facility] [place] [phone]           Tawnya CrookHogan, Guila Owensby Donovan 03/20/2016, 8:39 PM

## 2016-03-20 NOTE — MAU Note (Signed)
Pt reports she has not had a period in 2 months, also reports a clear discharge from her breasts. States she has nasal congestion.

## 2016-03-20 NOTE — Discharge Instructions (Signed)
Secondary Amenorrhea Secondary amenorrhea is the stopping of menstrual flow for 3-6 months in a female who has previously had periods. There are many possible causes. Most of these causes are not serious. Usually, treating the underlying problem causing the loss of menses will return your periods to normal. What are the causes? Some common and uncommon causes of not menstruating include:  Malnutrition.  Low blood sugar (hypoglycemia).  Polycystic ovary disease.  Stress or fear.  Breastfeeding.  Hormone imbalance.  Ovarian failure.  Medicines.  Extreme obesity.  Cystic fibrosis.  Low body weight or drastic weight reduction from any cause.  Early menopause.  Removal of ovaries or uterus.  Contraceptives.  Illness.  Long-term (chronic) illnesses.  Cushing syndrome.  Thyroid problems.  Birth control pills, patches, or vaginal rings for birth control. What increases the risk? You may be at greater risk of secondary amenorrhea if:  You have a family history of this condition.  You have an eating disorder.  You do athletic training. How is this diagnosed? A diagnosis is made by your health care provider taking a medical history and doing a physical exam. This will include a pelvic exam to check for problems with your reproductive organs. Pregnancy must be ruled out. Often, numerous blood tests are done to measure different hormones in the body. Urine testing may be done. Specialized exams (ultrasound, CT scan, MRI, or hysteroscopy) may have to be done as well as measuring the body mass index (BMI). How is this treated? Treatment depends on the cause of the amenorrhea. If an eating disorder is present, this can be treated with an adequate diet and therapy. Chronic illnesses may improve with treatment of the illness. Amenorrhea may be corrected with medicines, lifestyle changes, or surgery. If the amenorrhea cannot be corrected, it is sometimes possible to create a false  menstruation with medicines. Follow these instructions at home:  Maintain a healthy diet.  Manage weight problems.  Exercise regularly but not excessively.  Get adequate sleep.  Manage stress.  Be aware of changes in your menstrual cycle. Keep a record of when your periods occur. Note the date your period starts, how long it lasts, and any problems. Contact a health care provider if: Your symptoms do not get better with treatment. This information is not intended to replace advice given to you by your health care provider. Make sure you discuss any questions you have with your health care provider. Document Released: 05/19/2006 Document Revised: 09/13/2015 Document Reviewed: 09/23/2012 Elsevier Interactive Patient Education  2017 [employer]  

## 2016-03-20 NOTE — MAU Note (Signed)
Pt reports cramping for one month missed her period for 2 months. Had a miscarriage in May Pt reports that she has a history of neg urine pregnancy test, but positive blood pregnancy test. Pt wants to know if she is not pregnant, what is causing her missed period and cramping. Pt states that she does not need anything for nasal congestion at this time.

## 2016-10-10 IMAGING — US US TRANSVAGINAL NON-OB
1 series · 15 of 25 positions shown · non-contrast
Comparison: None

CLINICAL DATA: Vaginal bleeding.

EXAM:
TRANSABDOMINAL AND TRANSVAGINAL ULTRASOUND OF PELVIS
TECHNIQUE: Both transabdominal and transvaginal ultrasound examinations of the
pelvis were performed. Transabdominal technique was performed for
global imaging of the pelvis including uterus, ovaries, adnexal
regions, and pelvic cul-de-sac. It was necessary to proceed with
endovaginal exam following the transabdominal exam to visualize the
ovaries and endometrium..

[Series 1: us transvaginal non-ob · 15 of 56 slices shown]
[im 1/56]
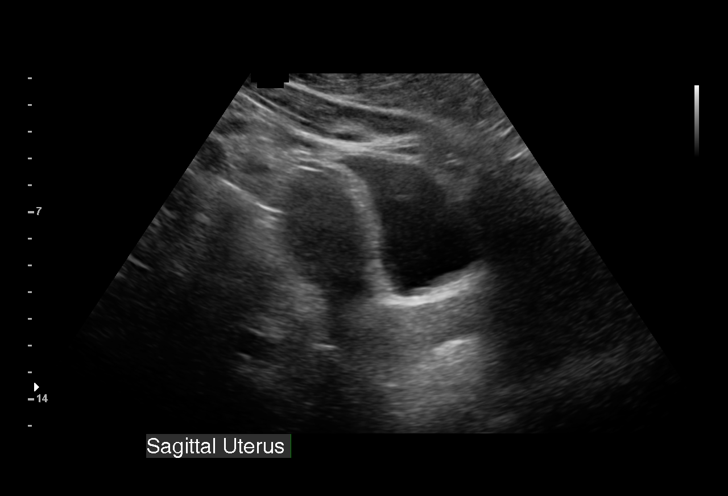
[im 5/56]
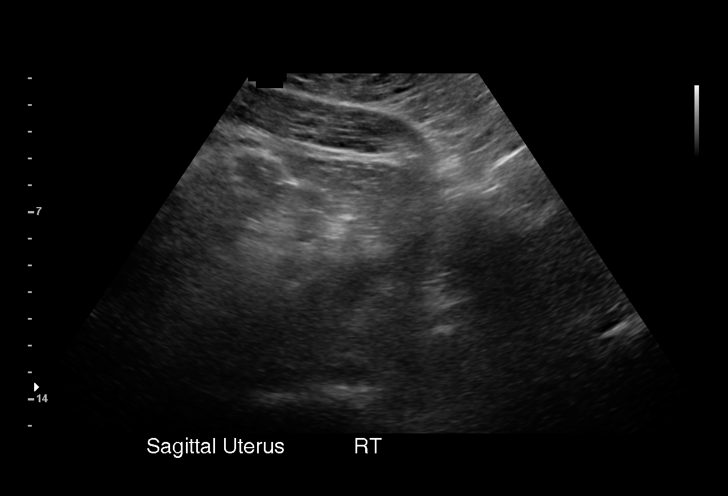
[im 10/56]
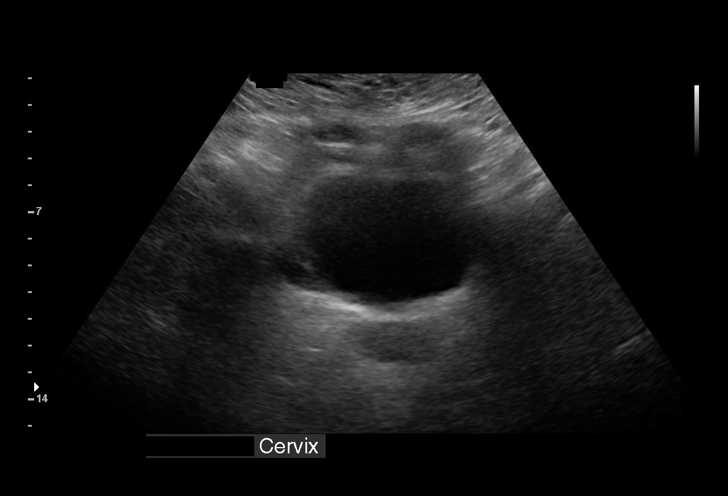
[im 12/56]
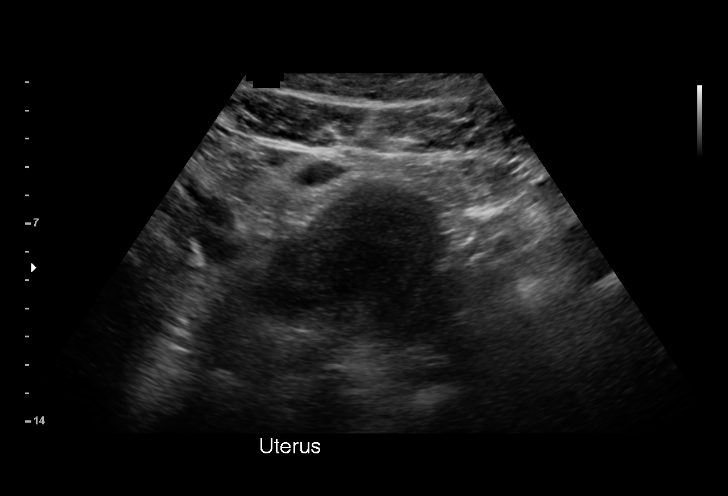
[im 17/56]
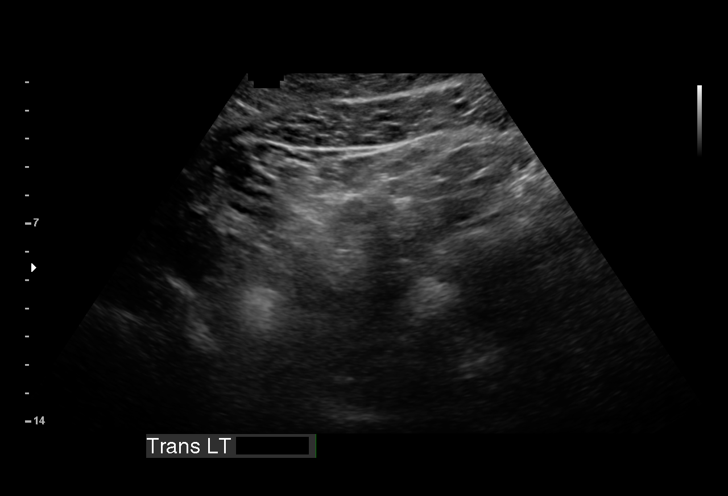
[im 21/56]
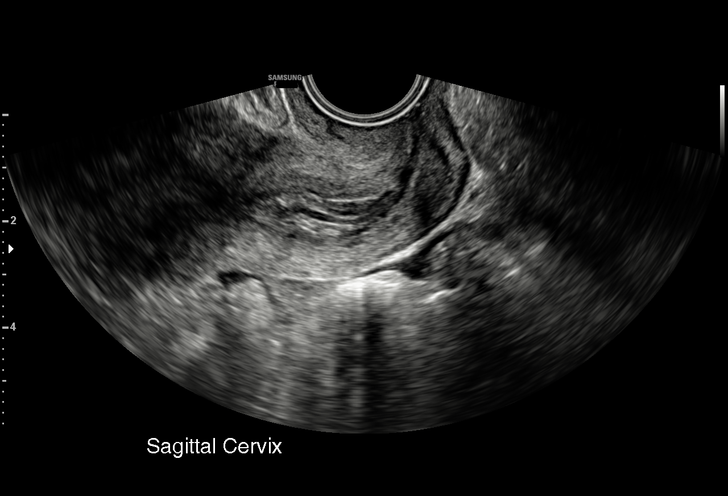
[im 23/56]
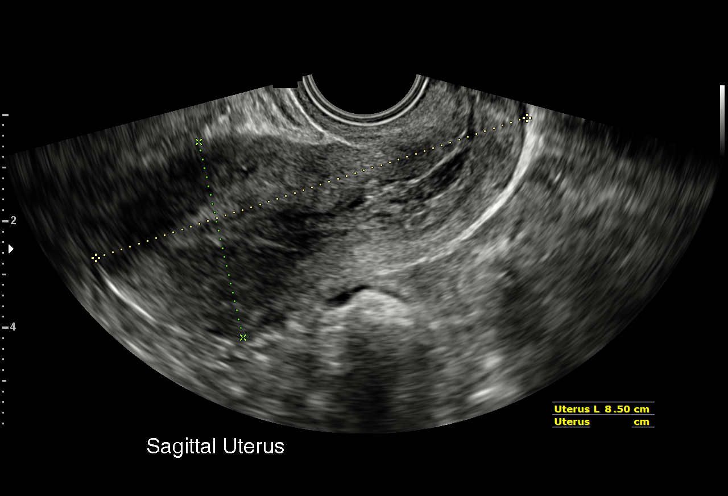
[im 28/56]
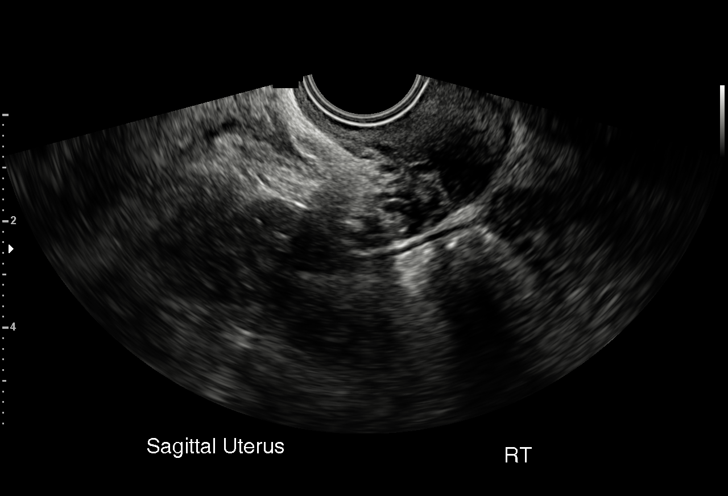
[im 33/56]
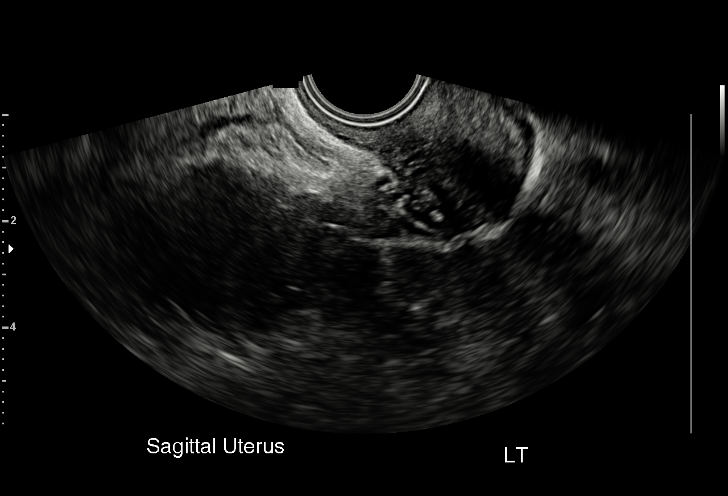
[im 35/56]
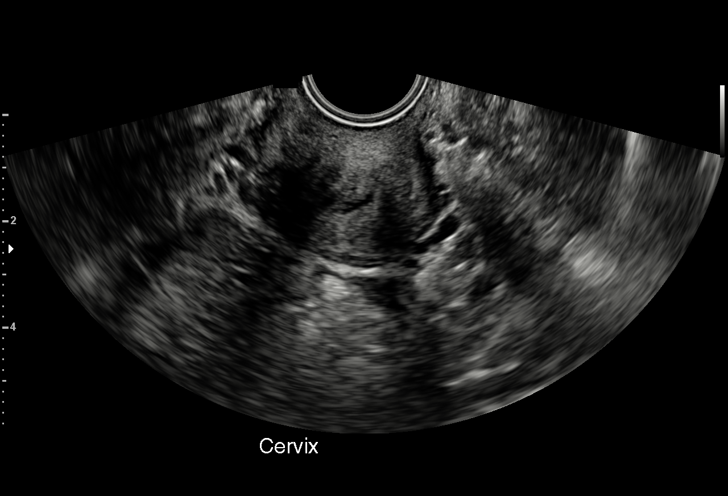
[im 39/56]
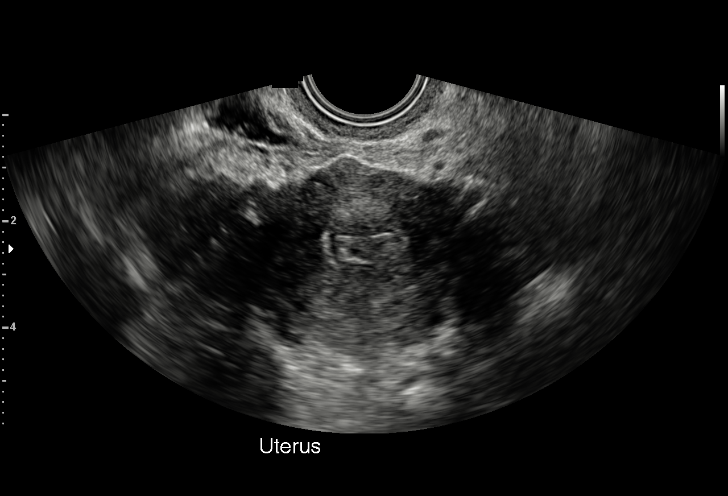
[im 44/56]
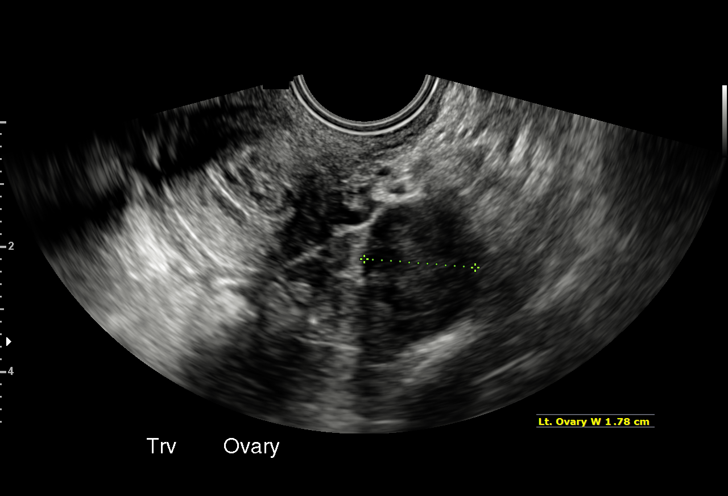
[im 46/56]
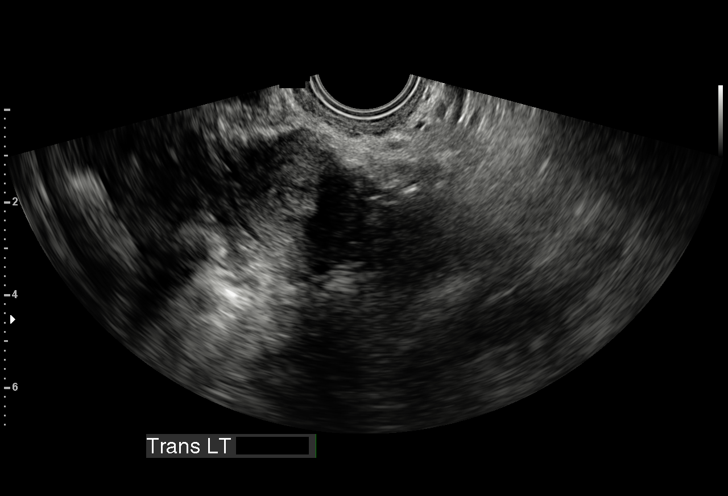
[im 51/56]
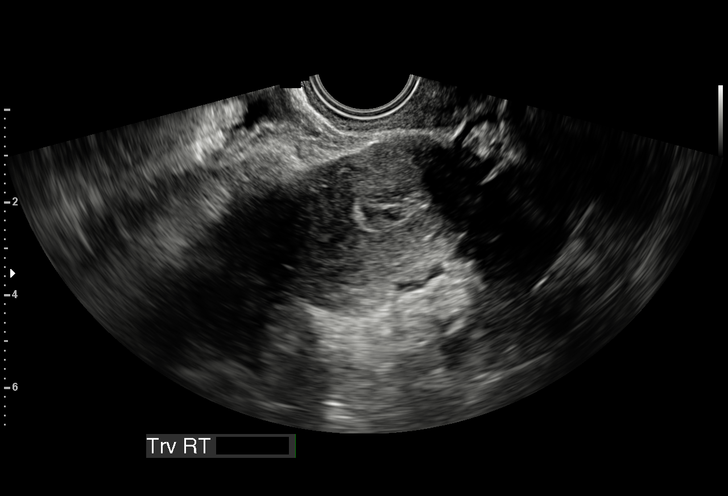
[im 56/56]
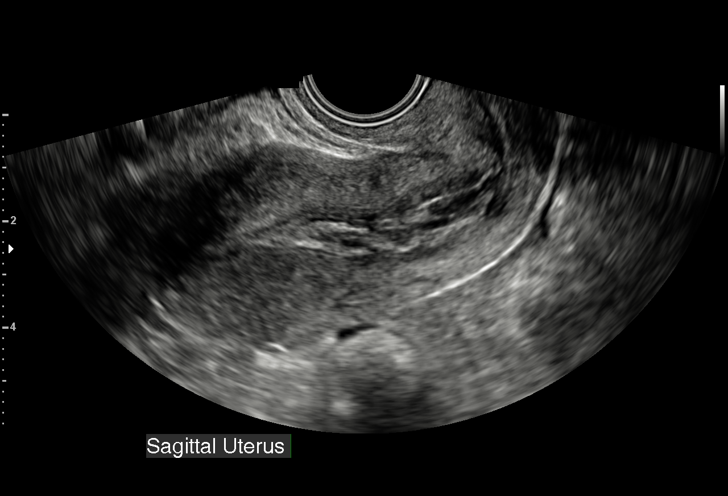

[15 of 25 positions shown; findings below may reference images not displayed]

FINDINGS: Uterus

Measurements: 8.5 x 3.8 x 4.0 cm. No fibroids or other mass
visualized.

Endometrium

Thickness: 7.0 mm. Heterogeneous endometrial contents, extending
into the dilated endocervical canal. This is likely hematoma. Could
not exclude products of conception.

Right ovary

Measurements: 2.7 x 2.0 x 2.3 cm. Normal appearance/no adnexal mass.

Left ovary

Measurements: 2.5 x 2.2 x 1.8 cm. Normal appearance/no adnexal mass.

Other findings

No abnormal free fluid.
IMPRESSION: 1. Heterogeneous endometrial contents extending into the dilated
upper cervical canal. This could be a combination of hematoma and
products of conception.
2. Normal myometrium.
3. Normal ovaries.

## 2016-12-07 IMAGING — US US OB TRANSVAGINAL
1 series · 15 of 28 positions shown · non-contrast
Comparison: Pelvic ultrasound 06/11/2015

CLINICAL DATA: Pregnant patient in first-trimester pregnancy with
vaginal bleeding and cramping. Beta HCG 387.

EXAM:
OBSTETRIC <14 WK US AND TRANSVAGINAL OB US
TECHNIQUE: Both transabdominal and transvaginal ultrasound examinations were
performed for complete evaluation of the gestation as well as the
maternal uterus, adnexal regions, and pelvic cul-de-sac.
Transvaginal technique was performed to assess early pregnancy.

[Series 1: us ob transvaginal · 15 of 49 slices shown]
[im 1/49]
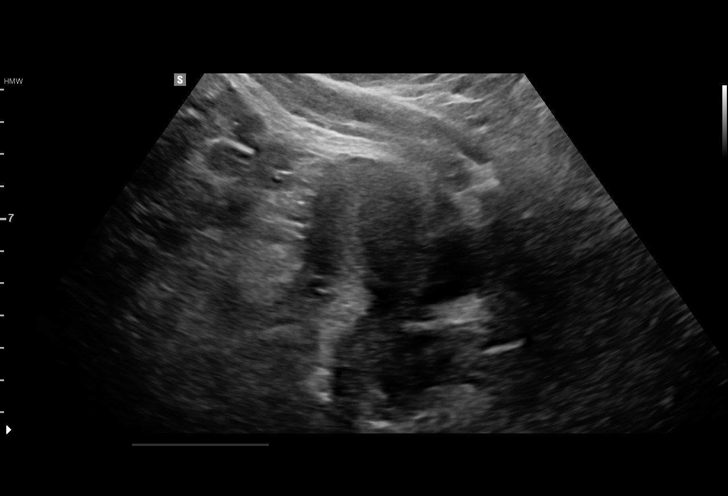
[im 4/49]
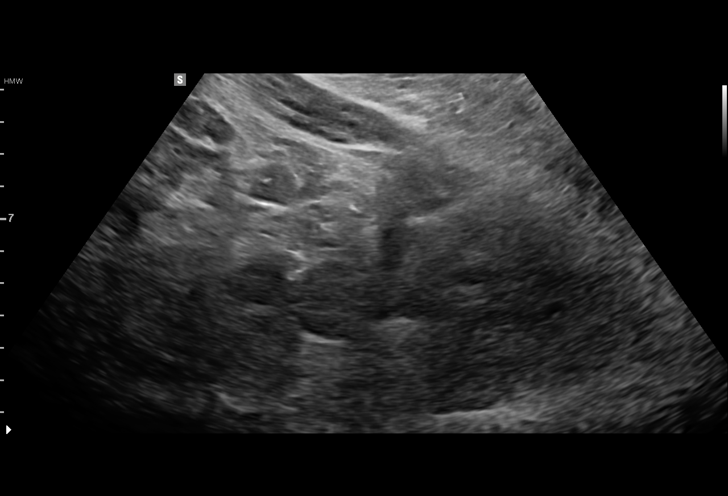
[im 8/49]
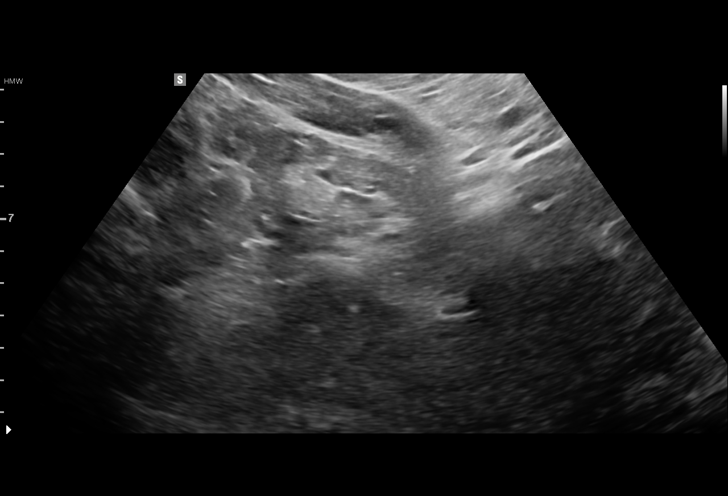
[im 11/49]
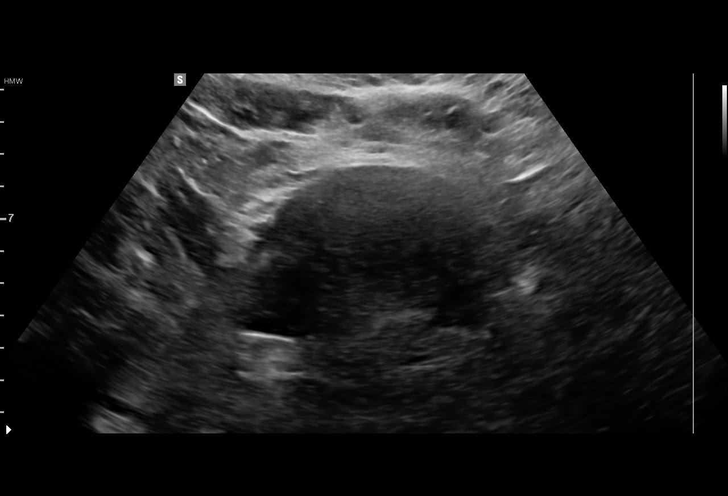
[im 15/49]
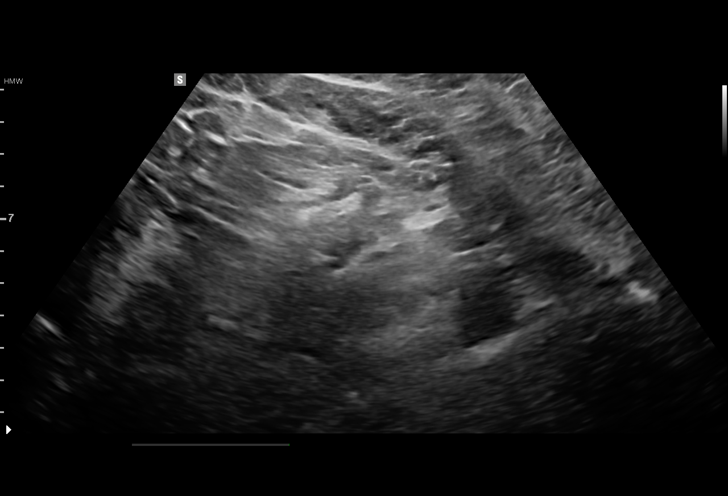
[im 18/49]
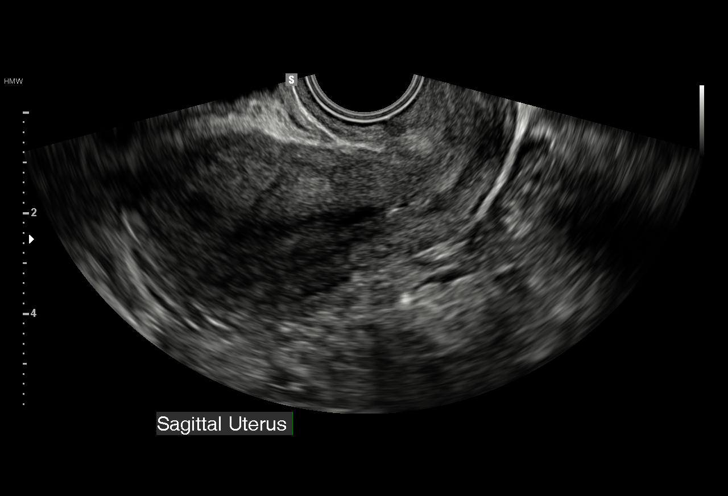
[im 22/49]
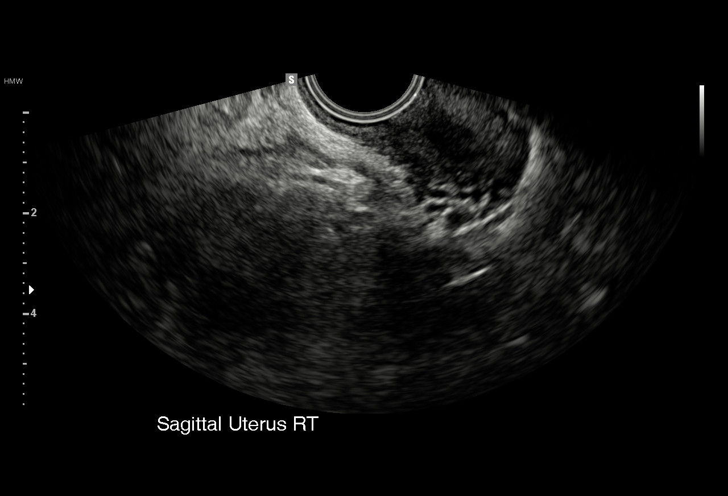
[im 25/49]
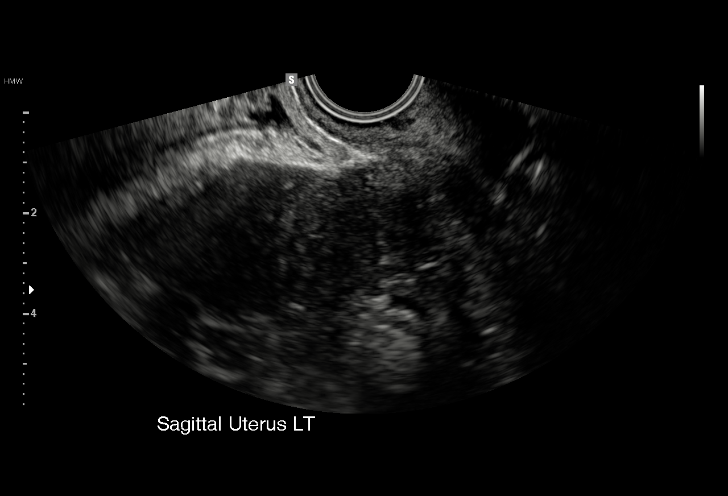
[im 27/49]
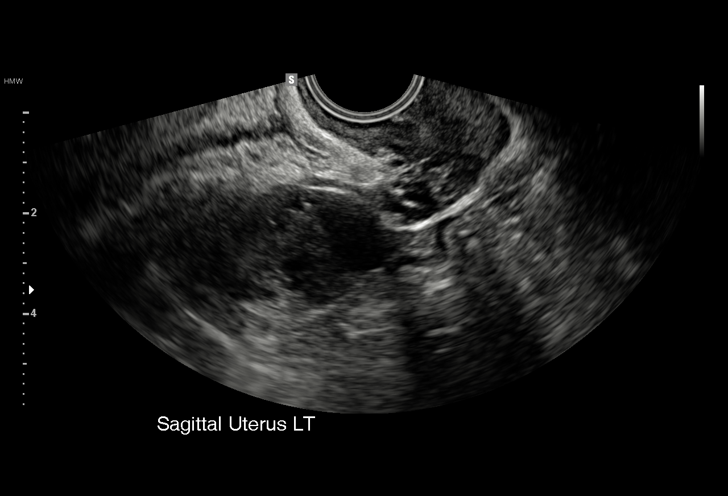
[im 31/49]
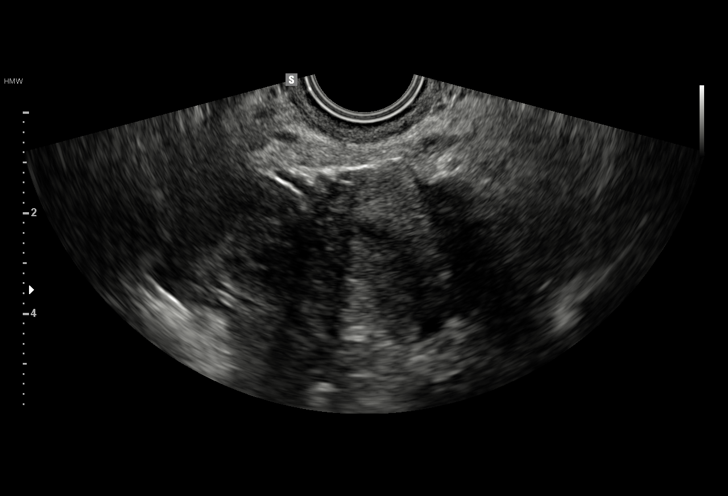
[im 34/49]
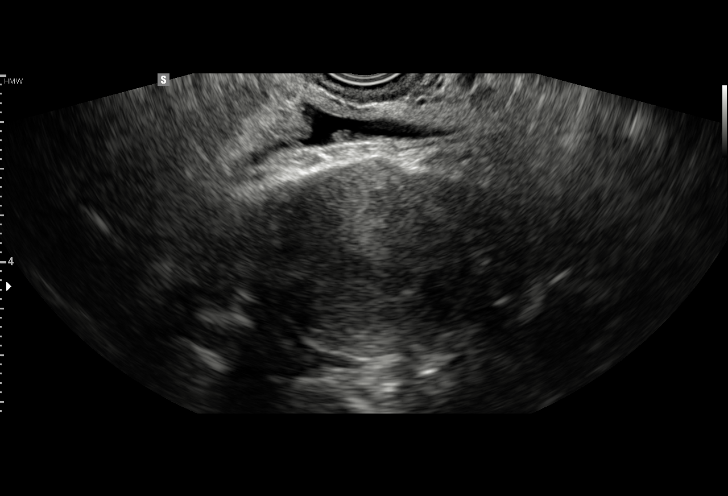
[im 38/49]
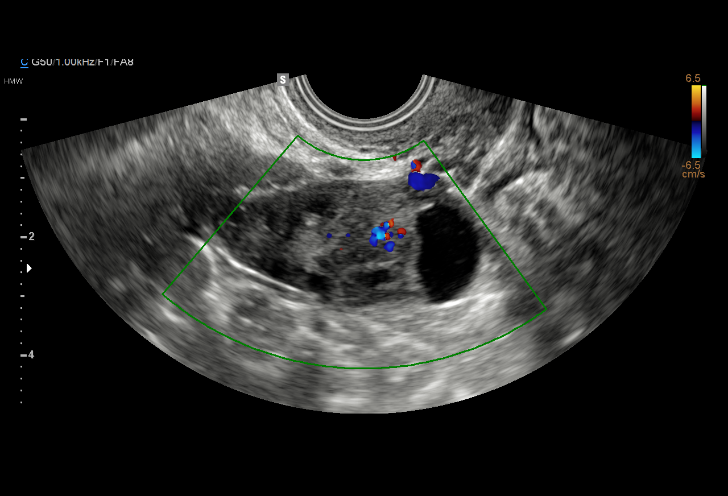
[im 41/49]
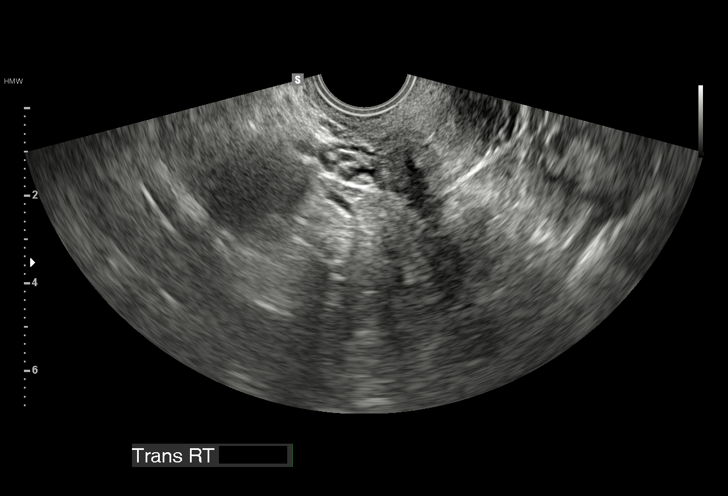
[im 45/49]
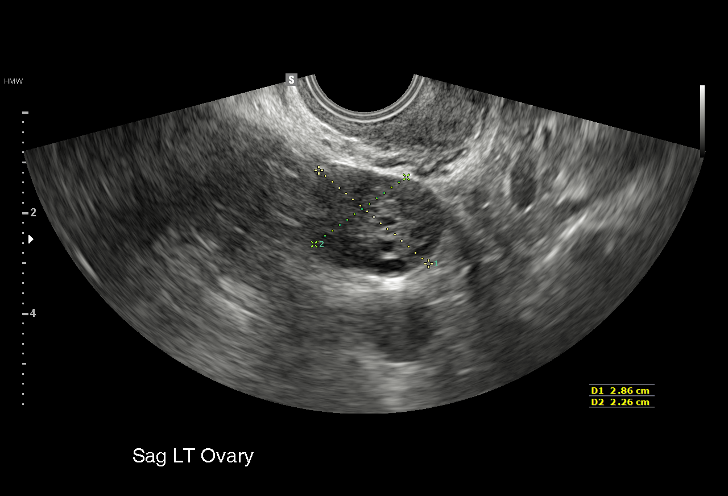
[im 49/49]
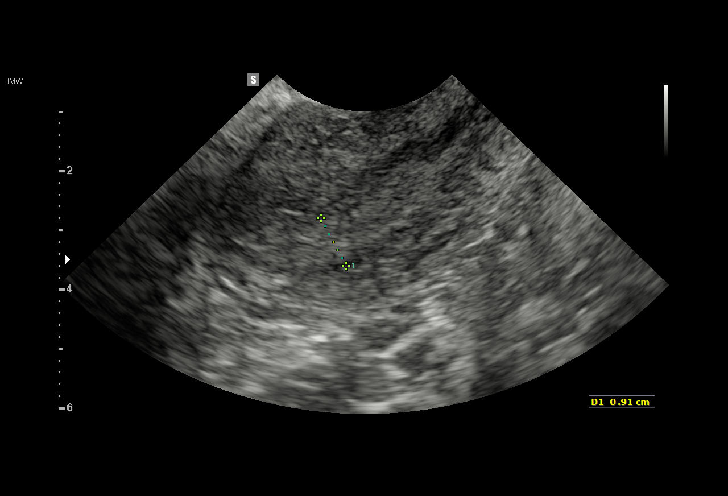

[15 of 28 positions shown; findings below may reference images not displayed]

FINDINGS: Intrauterine gestational sac: Not present

Yolk sac:  Not present

Embryo:  Not present.

Maternal uterus/adnexae: Endometrium measures 9 mm. The previous
heterogeneous material is not definitively seen. No endometrial
fluid. The right ovary appears normal with normal blood flow. The
left ovary appears normal with normal blood flow. There is no pelvic
free fluid.
IMPRESSION: No intrauterine pregnancy. No sonographic findings of ectopic
pregnancy. Findings consistent with pregnancy of unknown location.
Given beta HCG of 387, this is likely due to very early stage of
pregnancy, and too early to visualize sonographic.

Recommend continued trending of beta HCG and sonographic follow-up
as indicated.

## 2016-12-19 IMAGING — US US OB TRANSVAGINAL
1 series · 15 of 28 positions shown · non-contrast
Comparison: 08/16/2015

CLINICAL DATA: Bleeding since 3 p.m.. Intermittent spotting.
Estimated gestational age by LMP is 8 weeks 5 days. Quantitative
beta HCG is 816 and is rising.

EXAM:
TRANSVAGINAL OB ULTRASOUND
TECHNIQUE: Transvaginal ultrasound was performed for complete evaluation of the
gestation as well as the maternal uterus, adnexal regions, and
pelvic cul-de-sac.

[Series 1: us ob transvaginal · 15 of 35 slices shown]
[im 1/35]
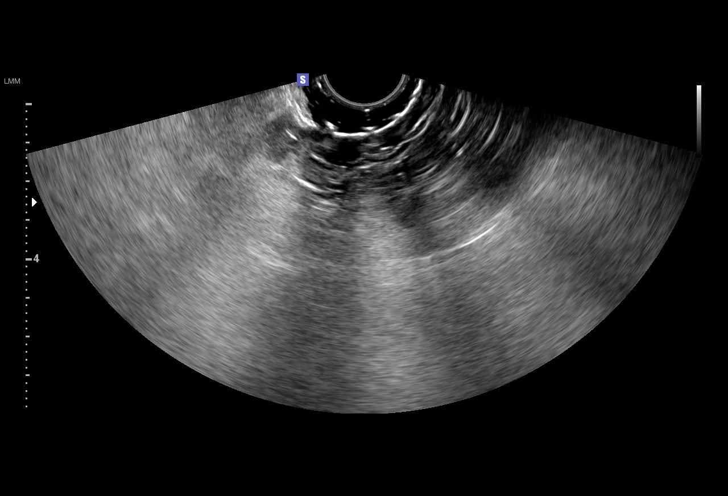
[im 3/35]
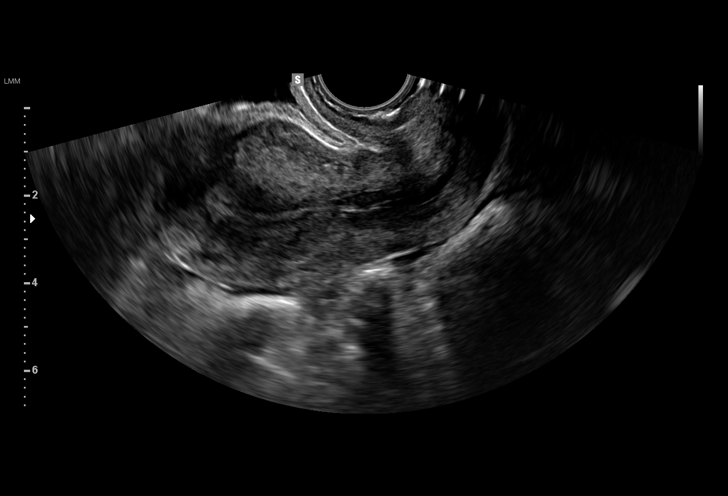
[im 6/35]
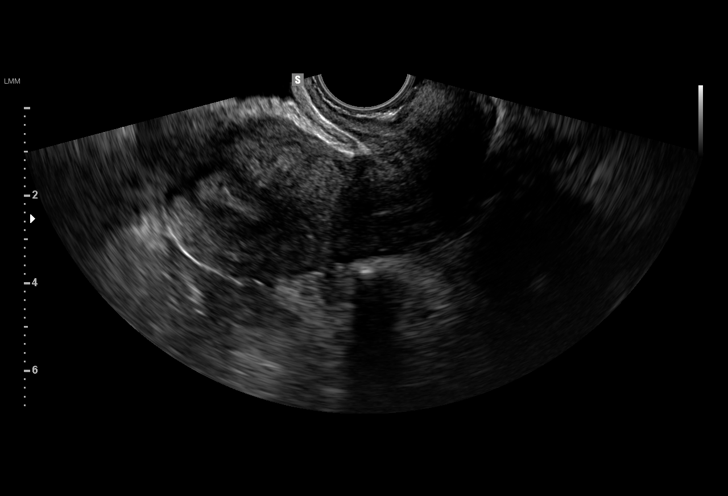
[im 8/35]
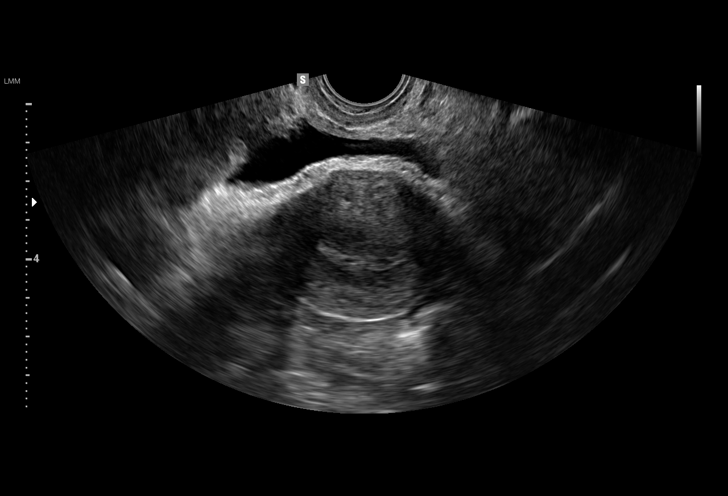
[im 11/35]
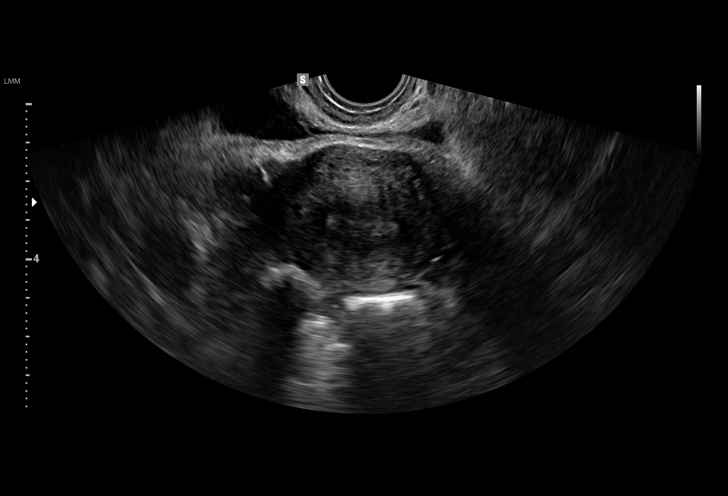
[im 13/35]
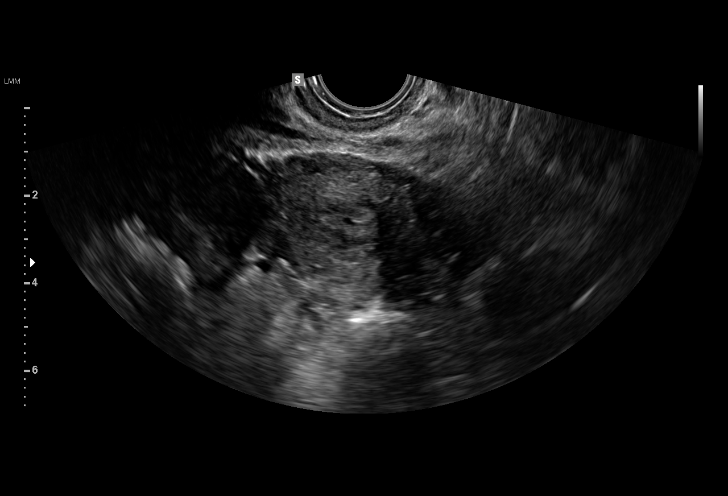
[im 16/35]
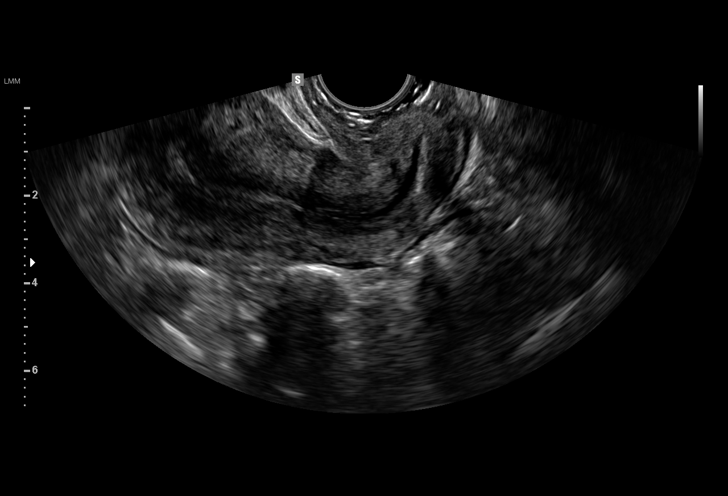
[im 18/35]
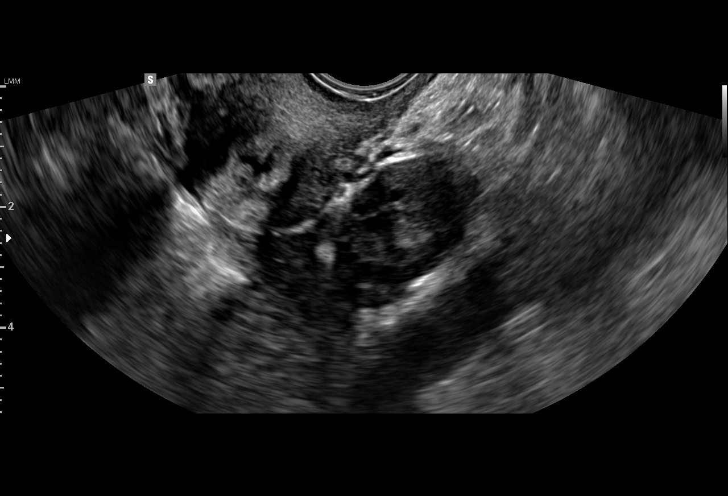
[im 19/35]
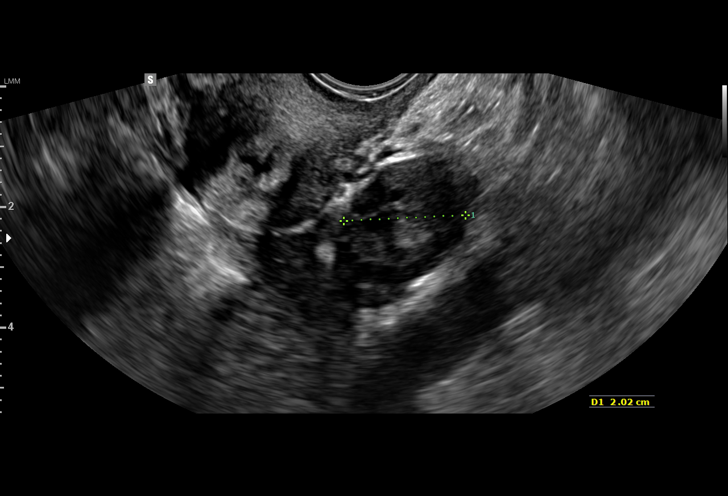
[im 22/35]
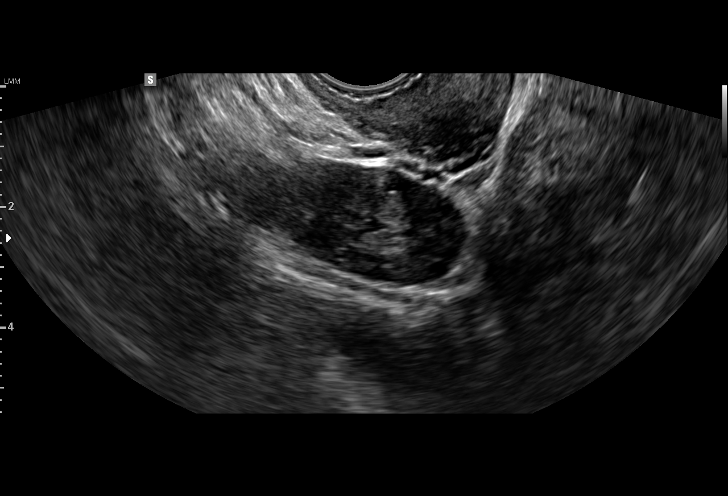
[im 24/35]
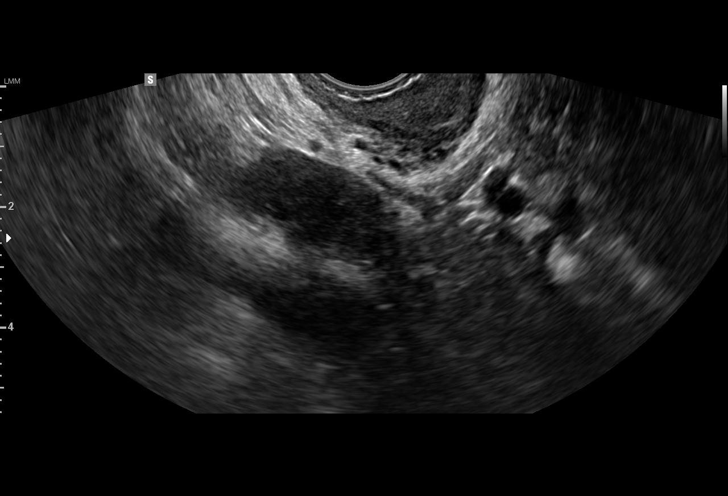
[im 27/35]
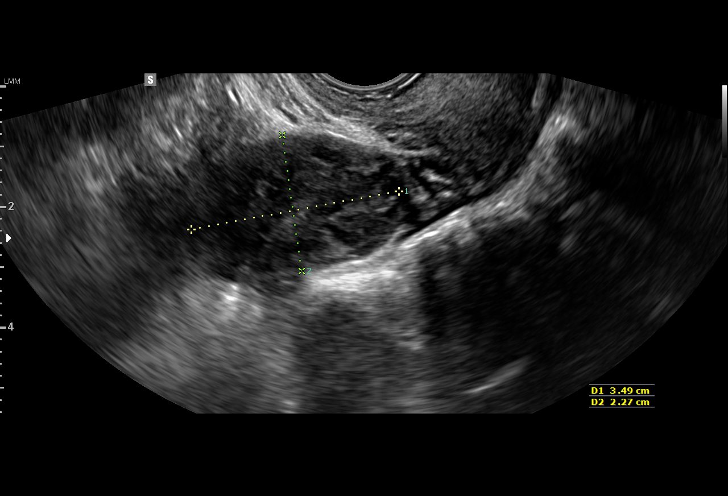
[im 29/35]
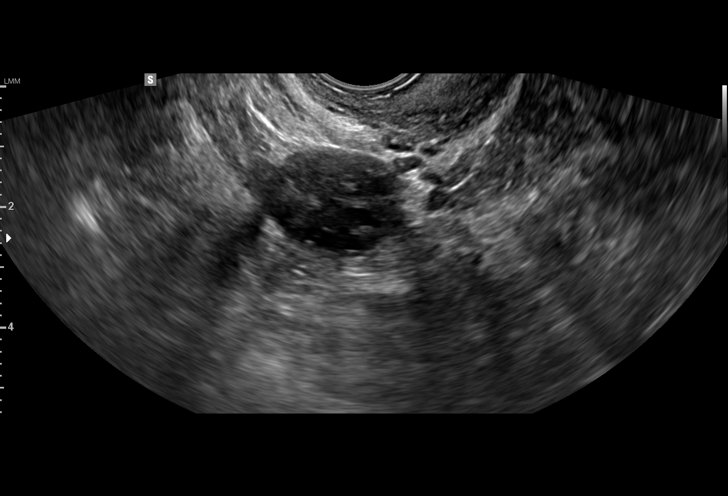
[im 32/35]
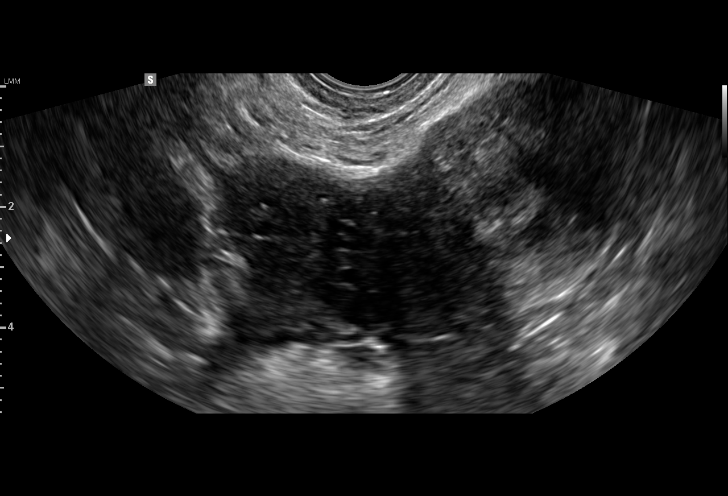
[im 35/35]
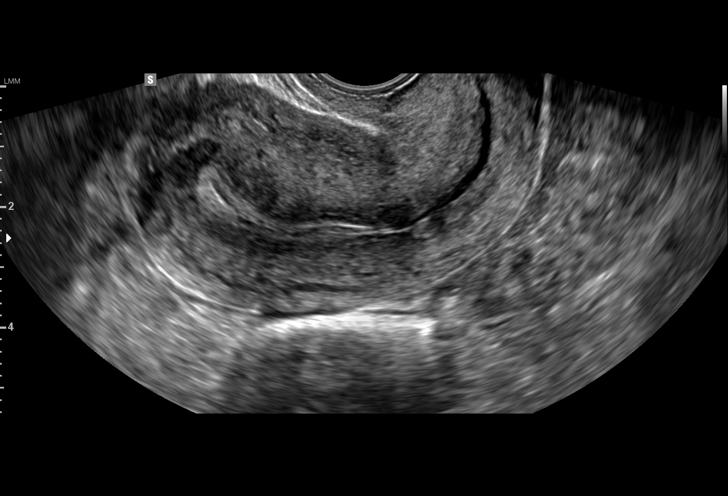

[15 of 28 positions shown; findings below may reference images not displayed]

FINDINGS: Intrauterine gestational sac: No intrauterine gestational sac
identified.

Yolk sac:  Not identified.

Embryo:  Not identified.

Cardiac Activity: Not identified.

Maternal uterus/adnexae: Homogeneous appearance of the myometrium.
Endometrial stripe is not significantly thickened. There is small
amount of fluid within the endometrium and more prominent fluid in
the endocervical canal. No myometrial mass lesions. Ovaries are
visualized with limited visualization. No abnormal adnexal masses
are seen. No free fluid identified in the pelvis.
IMPRESSION: No intrauterine pregnancy identified. Given history of rising
quantitative beta HCG, this indicates pregnancy of unknown location.
No intrauterine gestational sac, yolk sac, or fetal pole identified.
Differential considerations include intrauterine pregnancy too early
to be sonographically visualized, missed abortion, or ectopic
pregnancy. Followup ultrasound is recommended in 10-14 days for
further evaluation.

## 2016-12-28 IMAGING — US US OB TRANSVAGINAL
1 series · 16 of 28 positions shown · non-contrast
Comparison: 08/20/2015

CLINICAL DATA: Pregnancy of unknown location.

EXAM:
TRANSVAGINAL OB ULTRASOUND
TECHNIQUE: Transvaginal ultrasound was performed for complete evaluation of the
gestation as well as the maternal uterus, adnexal regions, and
pelvic cul-de-sac.

[Series 1: us ob transvaginal · 36 acquisitions, 16 frames shown]
[im 1/36]
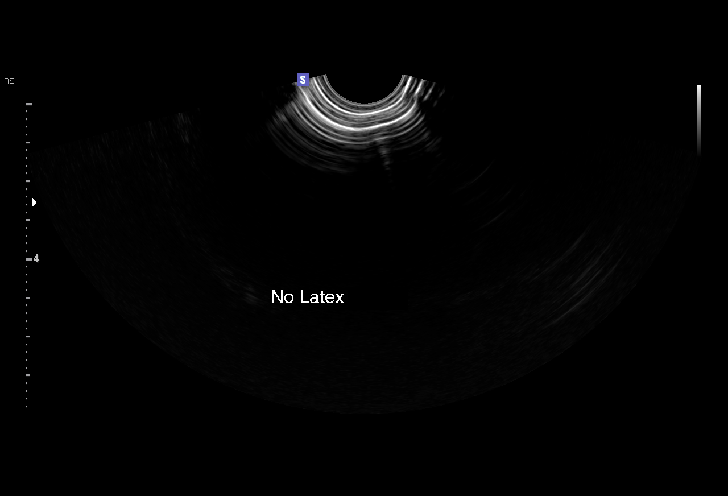
[im 3/36]
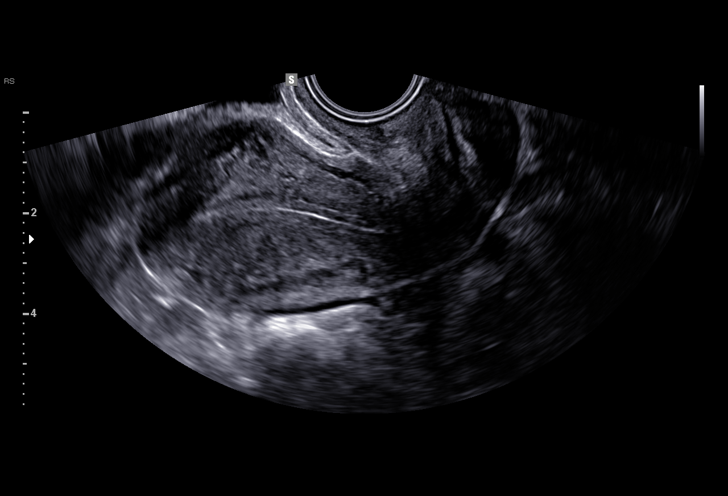
[im 6/36]
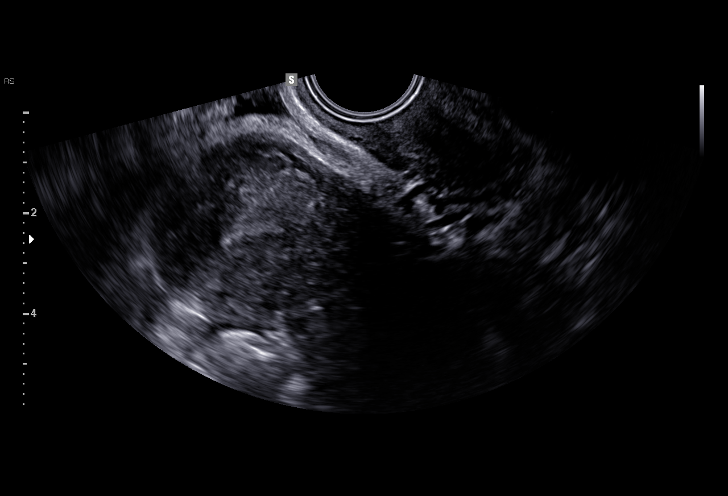
[im 8/36]
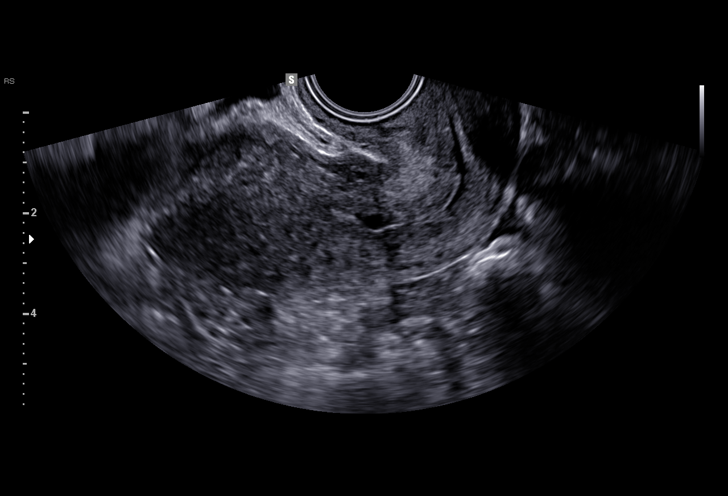
[im 10/36]
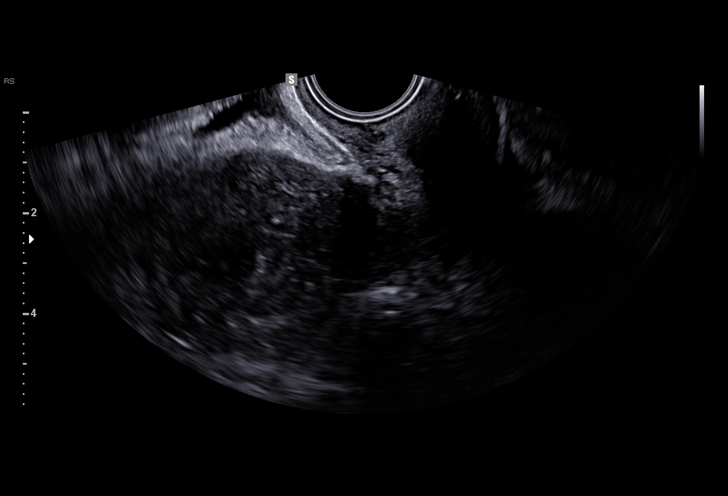
[im 12/36]
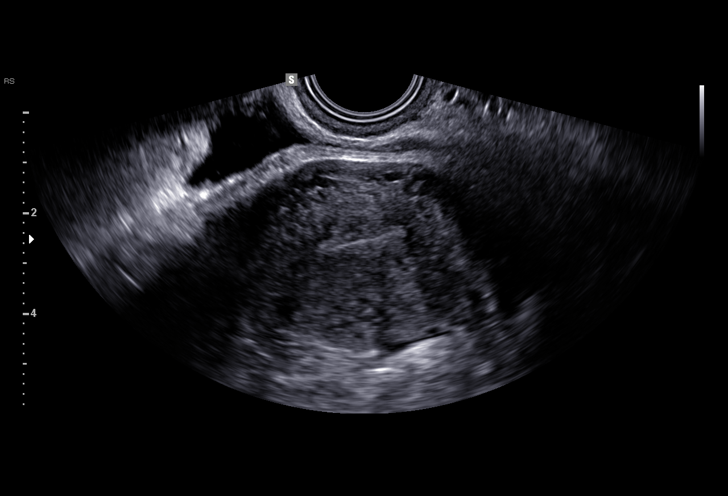
[im 15/36]
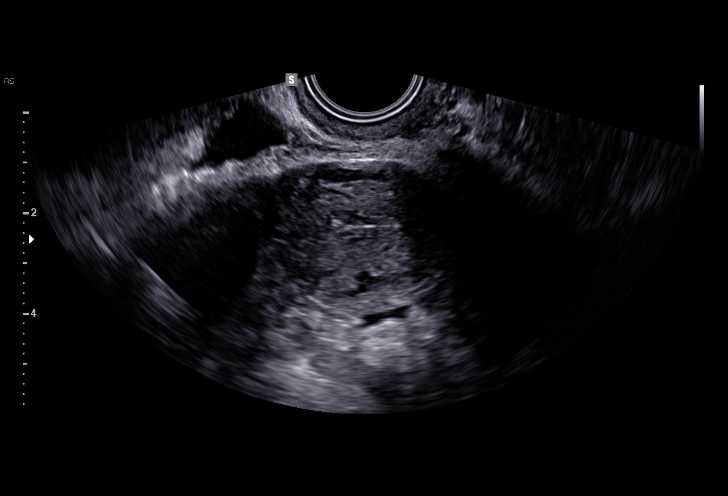
[im 17/36]
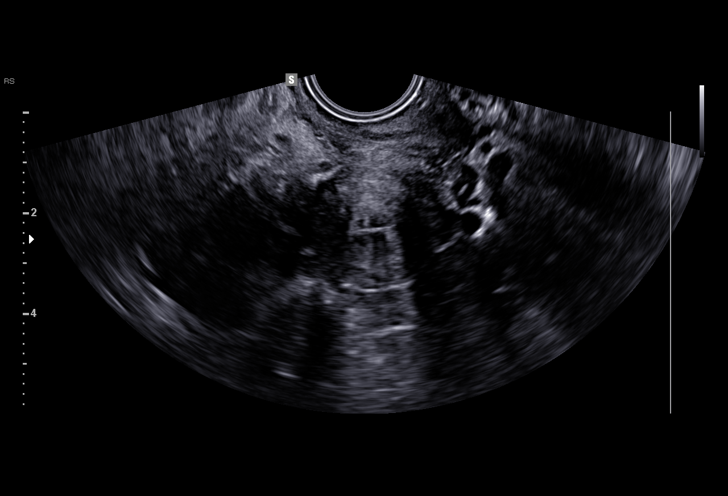
[im 19/36]
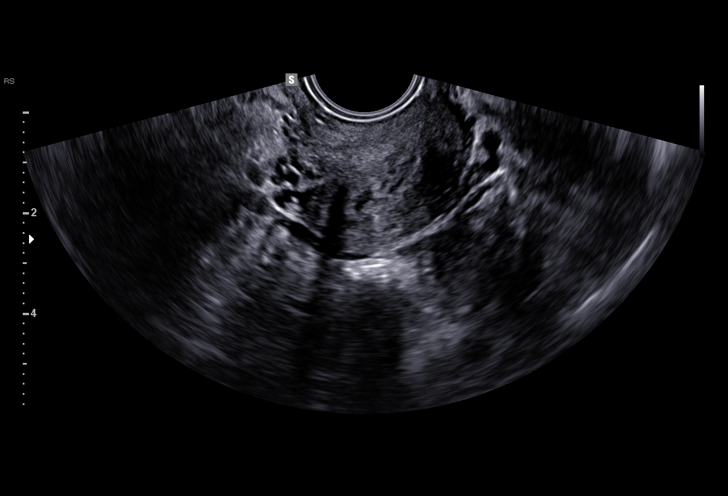
[im 21/36]
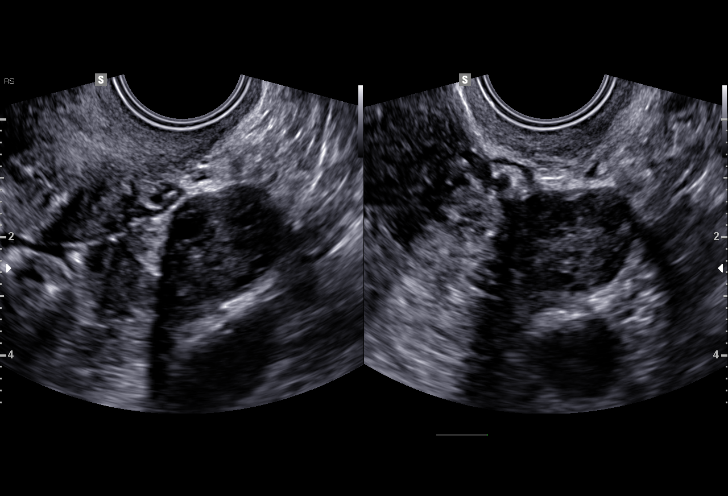
[im 24/36]
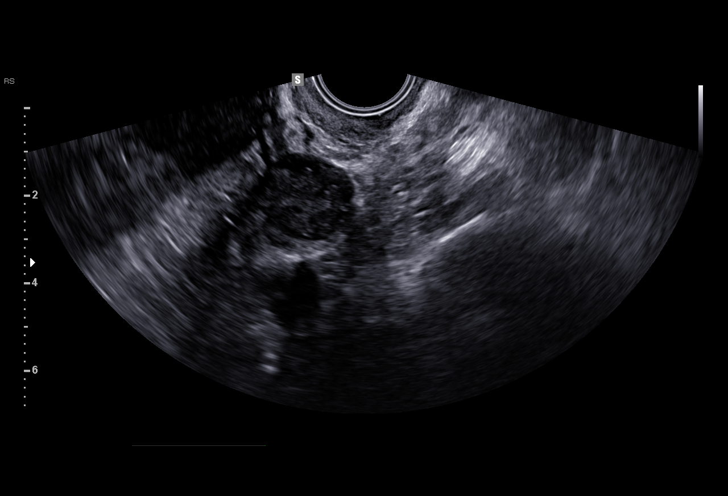
[im 26/36]
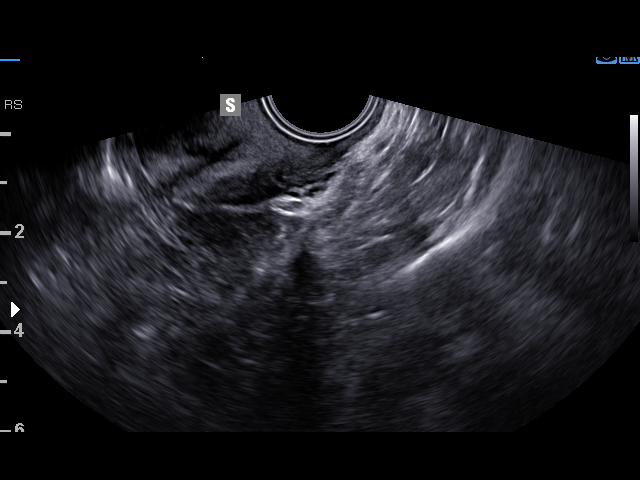
[im 28/36]
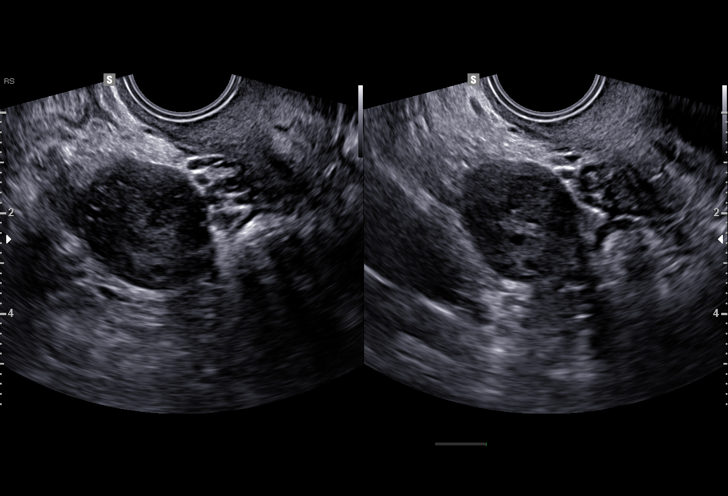
[im 30/36]
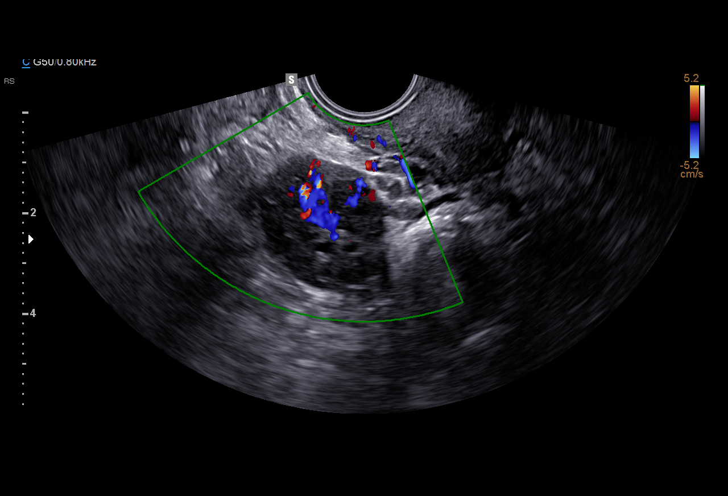
[im 33/36]
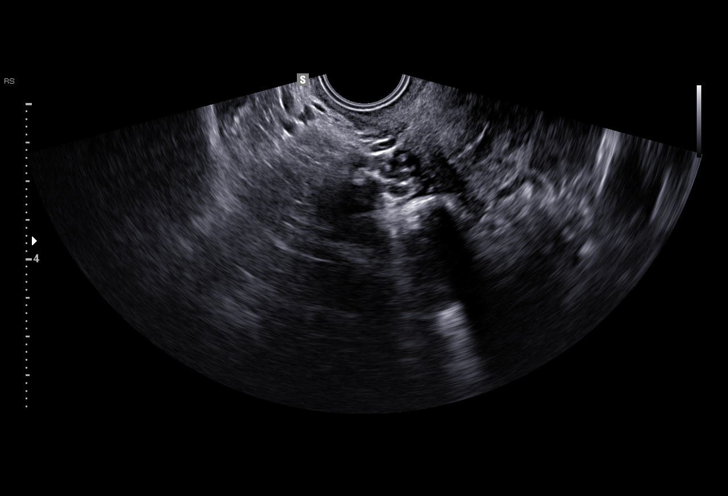
[im 36/36]
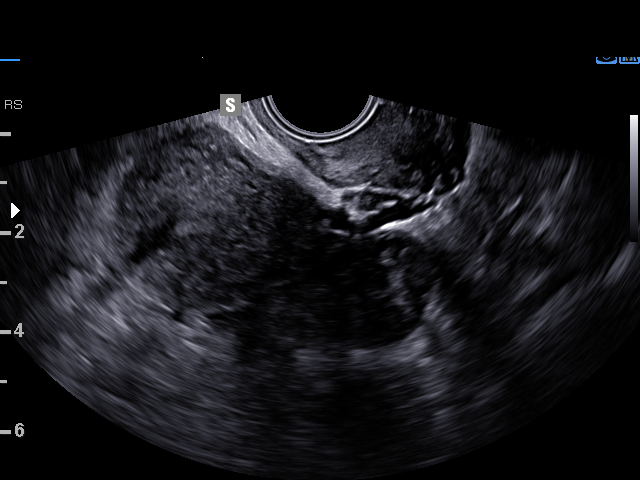

[16 of 28 positions shown; findings below may reference images not displayed]

FINDINGS: Intrauterine gestational sac: None

Yolk sac:  None

Embryo:  None

Cardiac Activity: None

Heart Rate:  bpm

MSD:   mm    w     d

CRL:     mm    w  d                  US EDC:

Subchorionic hemorrhage:  None visualized.

Maternal uterus/adnexae: Normal ovaries. No abnormal pelvic fluid
collections.
IMPRESSION: No visible gestational sac. Normal ovaries. No abnormal pelvic fluid
collections.

## 2017-03-05 ENCOUNTER — Other Ambulatory Visit: Payer: Self-pay | Admitting: Advanced Practice Midwife
